# Patient Record
Sex: Female | Born: 1960 | Race: White | Hispanic: No | Marital: Married | Smoking: Never smoker
Health system: Southern US, Community
[De-identification: ages and names within clinical notes are randomized; demographics above are authoritative.]

## PROBLEM LIST (undated history)

## (undated) DIAGNOSIS — E119 Type 2 diabetes mellitus without complications: Secondary | ICD-10-CM

## (undated) DIAGNOSIS — M8580 Other specified disorders of bone density and structure, unspecified site: Secondary | ICD-10-CM

## (undated) DIAGNOSIS — E785 Hyperlipidemia, unspecified: Secondary | ICD-10-CM

## (undated) DIAGNOSIS — I10 Essential (primary) hypertension: Secondary | ICD-10-CM

## (undated) HISTORY — PX: BREAST CYST ASPIRATION: SHX578

## (undated) HISTORY — DX: Hyperlipidemia, unspecified: E78.5

## (undated) HISTORY — DX: Type 2 diabetes mellitus without complications: E11.9

## (undated) HISTORY — DX: Other specified disorders of bone density and structure, unspecified site: M85.80

## (undated) HISTORY — DX: Essential (primary) hypertension: I10

---

## 1998-02-13 ENCOUNTER — Encounter: Admission: RE | Admit: 1998-02-13 | Discharge: 1998-02-13 | Payer: Self-pay | Admitting: *Deleted

## 1998-10-05 ENCOUNTER — Other Ambulatory Visit: Admission: RE | Admit: 1998-10-05 | Discharge: 1998-10-05 | Payer: Self-pay | Admitting: Obstetrics and Gynecology

## 1999-11-05 ENCOUNTER — Other Ambulatory Visit: Admission: RE | Admit: 1999-11-05 | Discharge: 1999-11-05 | Payer: Self-pay | Admitting: Obstetrics and Gynecology

## 2000-10-27 ENCOUNTER — Encounter: Payer: Self-pay | Admitting: Obstetrics and Gynecology

## 2000-10-27 ENCOUNTER — Ambulatory Visit (HOSPITAL_COMMUNITY): Admission: RE | Admit: 2000-10-27 | Discharge: 2000-10-27 | Payer: Self-pay | Admitting: Obstetrics and Gynecology

## 2001-02-05 ENCOUNTER — Other Ambulatory Visit: Admission: RE | Admit: 2001-02-05 | Discharge: 2001-02-05 | Payer: Self-pay | Admitting: Obstetrics and Gynecology

## 2002-02-01 ENCOUNTER — Ambulatory Visit (HOSPITAL_COMMUNITY): Admission: RE | Admit: 2002-02-01 | Discharge: 2002-02-01 | Payer: Self-pay | Admitting: Obstetrics and Gynecology

## 2002-02-01 ENCOUNTER — Encounter: Payer: Self-pay | Admitting: Obstetrics and Gynecology

## 2002-04-20 ENCOUNTER — Other Ambulatory Visit: Admission: RE | Admit: 2002-04-20 | Discharge: 2002-04-20 | Payer: Self-pay | Admitting: Obstetrics and Gynecology

## 2003-02-15 ENCOUNTER — Encounter: Payer: Self-pay | Admitting: Obstetrics and Gynecology

## 2003-02-15 ENCOUNTER — Encounter: Admission: RE | Admit: 2003-02-15 | Discharge: 2003-02-15 | Payer: Self-pay | Admitting: Obstetrics and Gynecology

## 2003-04-25 ENCOUNTER — Other Ambulatory Visit: Admission: RE | Admit: 2003-04-25 | Discharge: 2003-04-25 | Payer: Self-pay | Admitting: Obstetrics and Gynecology

## 2004-03-09 ENCOUNTER — Encounter: Admission: RE | Admit: 2004-03-09 | Discharge: 2004-03-09 | Payer: Self-pay | Admitting: Internal Medicine

## 2005-05-07 ENCOUNTER — Encounter: Admission: RE | Admit: 2005-05-07 | Discharge: 2005-05-07 | Payer: Self-pay | Admitting: Obstetrics and Gynecology

## 2006-06-06 ENCOUNTER — Ambulatory Visit (HOSPITAL_COMMUNITY): Admission: RE | Admit: 2006-06-06 | Discharge: 2006-06-06 | Payer: Self-pay | Admitting: Obstetrics and Gynecology

## 2007-06-24 ENCOUNTER — Ambulatory Visit (HOSPITAL_COMMUNITY): Admission: RE | Admit: 2007-06-24 | Discharge: 2007-06-24 | Payer: Self-pay | Admitting: Obstetrics and Gynecology

## 2008-06-27 ENCOUNTER — Ambulatory Visit (HOSPITAL_COMMUNITY): Admission: RE | Admit: 2008-06-27 | Discharge: 2008-06-27 | Payer: Self-pay | Admitting: Obstetrics and Gynecology

## 2009-06-29 ENCOUNTER — Ambulatory Visit (HOSPITAL_COMMUNITY): Admission: RE | Admit: 2009-06-29 | Discharge: 2009-06-29 | Payer: Self-pay | Admitting: Family Medicine

## 2010-07-04 ENCOUNTER — Ambulatory Visit (HOSPITAL_COMMUNITY)
Admission: RE | Admit: 2010-07-04 | Discharge: 2010-07-04 | Payer: Self-pay | Source: Home / Self Care | Attending: Obstetrics and Gynecology | Admitting: Obstetrics and Gynecology

## 2010-08-19 ENCOUNTER — Encounter: Payer: Self-pay | Admitting: Obstetrics and Gynecology

## 2011-06-10 ENCOUNTER — Other Ambulatory Visit (HOSPITAL_COMMUNITY): Payer: Self-pay | Admitting: Nurse Practitioner

## 2011-06-10 DIAGNOSIS — Z1231 Encounter for screening mammogram for malignant neoplasm of breast: Secondary | ICD-10-CM

## 2011-07-10 ENCOUNTER — Ambulatory Visit (HOSPITAL_COMMUNITY)
Admission: RE | Admit: 2011-07-10 | Discharge: 2011-07-10 | Disposition: A | Discharge status: Home / Self Care | Payer: BC Managed Care – PPO | Source: Ambulatory Visit | Attending: Nurse Practitioner | Admitting: Nurse Practitioner

## 2011-07-10 DIAGNOSIS — Z1231 Encounter for screening mammogram for malignant neoplasm of breast: Secondary | ICD-10-CM

## 2012-10-29 ENCOUNTER — Other Ambulatory Visit (HOSPITAL_COMMUNITY): Payer: Self-pay | Admitting: Nurse Practitioner

## 2012-10-29 DIAGNOSIS — Z1231 Encounter for screening mammogram for malignant neoplasm of breast: Secondary | ICD-10-CM

## 2012-11-05 ENCOUNTER — Ambulatory Visit (HOSPITAL_COMMUNITY): Payer: BC Managed Care – PPO

## 2012-11-06 ENCOUNTER — Ambulatory Visit (HOSPITAL_COMMUNITY)
Admission: RE | Admit: 2012-11-06 | Discharge: 2012-11-06 | Disposition: A | Discharge status: Home / Self Care | Payer: BC Managed Care – PPO | Source: Ambulatory Visit | Attending: Nurse Practitioner | Admitting: Nurse Practitioner

## 2012-11-06 DIAGNOSIS — Z1231 Encounter for screening mammogram for malignant neoplasm of breast: Secondary | ICD-10-CM | POA: Insufficient documentation

## 2013-10-20 ENCOUNTER — Other Ambulatory Visit (HOSPITAL_COMMUNITY): Payer: Self-pay | Admitting: Nurse Practitioner

## 2013-10-20 DIAGNOSIS — Z1231 Encounter for screening mammogram for malignant neoplasm of breast: Secondary | ICD-10-CM

## 2013-11-08 ENCOUNTER — Ambulatory Visit (HOSPITAL_COMMUNITY)
Admission: RE | Admit: 2013-11-08 | Discharge: 2013-11-08 | Disposition: A | Discharge status: Home / Self Care | Payer: BC Managed Care – PPO | Source: Ambulatory Visit | Attending: Nurse Practitioner | Admitting: Nurse Practitioner

## 2013-11-08 DIAGNOSIS — Z1231 Encounter for screening mammogram for malignant neoplasm of breast: Secondary | ICD-10-CM | POA: Insufficient documentation

## 2014-03-14 ENCOUNTER — Ambulatory Visit (INDEPENDENT_AMBULATORY_CARE_PROVIDER_SITE_OTHER): Payer: BC Managed Care – PPO | Admitting: Podiatry

## 2014-03-14 ENCOUNTER — Encounter: Payer: Self-pay | Admitting: Podiatry

## 2014-03-14 ENCOUNTER — Ambulatory Visit (INDEPENDENT_AMBULATORY_CARE_PROVIDER_SITE_OTHER): Payer: BC Managed Care – PPO

## 2014-03-14 VITALS — BP 108/55 | HR 98 | Resp 16 | Ht 63.0 in | Wt 160.0 lb

## 2014-03-14 DIAGNOSIS — M722 Plantar fascial fibromatosis: Secondary | ICD-10-CM

## 2014-03-14 MED ORDER — TRIAMCINOLONE ACETONIDE 10 MG/ML IJ SUSP
10.0000 mg | Freq: Once | INTRAMUSCULAR | Status: AC
  Administered 2014-03-14: 10 mg

## 2014-03-14 NOTE — Progress Notes (Signed)
   Subjective:    Patient ID: Hannah Cantrell, female    DOB: October 14, 1960, 53 y.o.   MRN: [id number]  HPI Comments: "I have pain in the right one"  Patient c/o aching plantar heel and some posterior heel right foot for a few months. She does have AM pain but usually gets worse by end of day. Saw PCP and was Rx 'd meloxicam,. Using water bottle massage and new shoes at home.   Foot Pain Associated symptoms include a rash.      Review of Systems  Skin: Positive for rash.       Change in nails  All other systems reviewed and are negative.      Objective:   Physical Exam        Assessment & Plan:

## 2014-03-14 NOTE — Patient Instructions (Signed)

## 2014-03-16 NOTE — Progress Notes (Signed)
Subjective:     Patient ID: Hannah Cantrell, female   DOB: [birth year], 53 y.o.   MRN: [id number]  Foot Pain   patient is found to have painful plantar heel right of several months duration with pain upon palpation and worse pain in the morning and now pretty much all times during the day   Review of Systems  All other systems reviewed and are negative.      Objective:   Physical Exam  Nursing note and vitals reviewed. Constitutional: She is oriented to person, place, and time.  Cardiovascular: Intact distal pulses.   Musculoskeletal: Normal range of motion.  Neurological: She is oriented to person, place, and time.  Skin: Skin is warm.   neurovascular status found to be intact with muscle strength adequate and range of motion subtalar and midtarsal joint within normal limits. Patient is noted to have significant discomfort plantar heel at the insertional point of the tendon into the calcaneus with inflammation slightly distal also noted and no indications of forefoot pathology. Digits are well-perfused and arch height is mildly diminished upon weightbearing     Assessment:     Acute plan her fasciitis right that has failed to respond to conservative care up to this point    Plan:     H&P and x-ray reviewed. Injected the plantar fascia 3 mg Kenalog 5 mg like Marcaine mixture and dispensed a fascially brace for daily usage. Do to intense morning pain I did dispensed night splint to sleeping and instructed on ice therapy and placed on diclofenac 75 mg twice a day and reappoint again in 1 week for consideration of orthotics if we can convert this to a chronic problem

## 2014-03-17 ENCOUNTER — Ambulatory Visit: Payer: Self-pay | Admitting: Podiatry

## 2014-04-05 ENCOUNTER — Encounter: Payer: Self-pay | Admitting: Podiatry

## 2014-04-05 ENCOUNTER — Ambulatory Visit (INDEPENDENT_AMBULATORY_CARE_PROVIDER_SITE_OTHER): Payer: BC Managed Care – PPO | Admitting: Podiatry

## 2014-04-05 VITALS — BP 93/75 | HR 111 | Resp 16

## 2014-04-05 DIAGNOSIS — M722 Plantar fascial fibromatosis: Secondary | ICD-10-CM

## 2014-04-05 NOTE — Progress Notes (Signed)
She presents today for followup of plantar fasciitis. She was doing very well after having seen Dr. Paulla Dolly in the subsequent injection. However over the weekend she was chasing a dog and felt a pull in her heel.  Objective: Vital signs are stable she is alert and oriented x3. Pulses are palpable. She has pain on palpation medial continued tubercle of her right heel.  Assessment: Plantar fasciitis right.  Plan: Injected the right heel today with Kenalog and local anesthetic. She will followup with Dr. Paulla Dolly in one month.

## 2014-04-05 NOTE — Patient Instructions (Signed)

## 2014-04-08 ENCOUNTER — Other Ambulatory Visit: Payer: BC Managed Care – PPO

## 2014-05-02 ENCOUNTER — Ambulatory Visit (INDEPENDENT_AMBULATORY_CARE_PROVIDER_SITE_OTHER): Payer: BC Managed Care – PPO | Admitting: Podiatry

## 2014-05-02 ENCOUNTER — Encounter: Payer: Self-pay | Admitting: Podiatry

## 2014-05-02 VITALS — BP 110/65 | HR 66 | Resp 17

## 2014-05-02 DIAGNOSIS — M722 Plantar fascial fibromatosis: Secondary | ICD-10-CM

## 2014-05-03 NOTE — Progress Notes (Signed)
Subjective:     Patient ID: Hannah Cantrell, female   DOB: Oct 23, 1960, 53 y.o.   MRN: [id number]  HPI patient states I'm still getting pain in my right heel when I walk and it seems to be worse when I get up in the morning   Review of Systems     Objective:   Physical Exam Neurovascular status intact with pain to palpation medial calcaneal tubercle right at the insertion of the plantar fashion    Assessment:     Plantar fasciitis right with inflammation and fluid buildup still noted especially after rest    Plan:     Reviewed condition and exercises physical therapy. At this time I went ahead and I dispensed a night splint with instructions on usage and reappoint her recheck

## 2014-05-30 ENCOUNTER — Ambulatory Visit: Payer: BC Managed Care – PPO | Admitting: Podiatry

## 2014-12-09 ENCOUNTER — Other Ambulatory Visit (HOSPITAL_COMMUNITY): Payer: Self-pay | Admitting: Nurse Practitioner

## 2014-12-09 DIAGNOSIS — Z1231 Encounter for screening mammogram for malignant neoplasm of breast: Secondary | ICD-10-CM

## 2014-12-28 ENCOUNTER — Ambulatory Visit (HOSPITAL_COMMUNITY)
Admission: RE | Admit: 2014-12-28 | Discharge: 2014-12-28 | Disposition: A | Discharge status: Home / Self Care | Payer: BC Managed Care – PPO | Source: Ambulatory Visit | Attending: Nurse Practitioner | Admitting: Nurse Practitioner

## 2014-12-28 DIAGNOSIS — Z1231 Encounter for screening mammogram for malignant neoplasm of breast: Secondary | ICD-10-CM | POA: Diagnosis not present

## 2015-12-01 ENCOUNTER — Other Ambulatory Visit: Payer: Self-pay

## 2015-12-01 DIAGNOSIS — Z1231 Encounter for screening mammogram for malignant neoplasm of breast: Secondary | ICD-10-CM

## 2016-01-01 ENCOUNTER — Ambulatory Visit: Payer: BC Managed Care – PPO

## 2016-01-15 ENCOUNTER — Other Ambulatory Visit: Payer: Self-pay | Admitting: Nurse Practitioner

## 2016-01-15 ENCOUNTER — Ambulatory Visit
Admission: RE | Admit: 2016-01-15 | Discharge: 2016-01-15 | Disposition: A | Discharge status: Home / Self Care | Payer: BC Managed Care – PPO | Source: Ambulatory Visit

## 2016-01-15 DIAGNOSIS — Z1231 Encounter for screening mammogram for malignant neoplasm of breast: Secondary | ICD-10-CM

## 2016-07-02 ENCOUNTER — Telehealth (INDEPENDENT_AMBULATORY_CARE_PROVIDER_SITE_OTHER): Payer: Self-pay | Admitting: Orthopaedic Surgery

## 2016-07-02 NOTE — Telephone Encounter (Signed)
She hasn't had one since May, she can have one whenever she wants

## 2016-07-10 ENCOUNTER — Ambulatory Visit (INDEPENDENT_AMBULATORY_CARE_PROVIDER_SITE_OTHER): Payer: BC Managed Care – PPO | Admitting: Orthopaedic Surgery

## 2016-08-05 ENCOUNTER — Ambulatory Visit (INDEPENDENT_AMBULATORY_CARE_PROVIDER_SITE_OTHER): Payer: BC Managed Care – PPO | Admitting: Orthopaedic Surgery

## 2016-08-05 DIAGNOSIS — M7062 Trochanteric bursitis, left hip: Secondary | ICD-10-CM

## 2016-08-05 MED ORDER — LIDOCAINE HCL 1 % IJ SOLN
3.0000 mL | INTRAMUSCULAR | Status: AC | PRN
  Administered 2016-08-05: 3 mL

## 2016-08-05 NOTE — Progress Notes (Signed)
   Office Visit Note   Patient: Hannah Cantrell           Date of Birth: [birth year]           MRN: [id number] Visit Date: 08/05/2016              Requested by: Chesley Noon, MD [place], [place] [id number] PCP: Chesley Noon, MD   Assessment & Plan: Visit Diagnoses: No diagnosis found.  Plan: She tolerated the left hip trochanteric injection well. This point she'll follow-up as needed. I showed her stretching exercises to try and she'll take naproxen as well. If she has any issues she'll let us know.  Follow-Up Instructions: Return if symptoms worsen or fail to improve.   Orders:  No orders of the defined types were placed in this encounter.  No orders of the defined types were placed in this encounter.     Procedures: Large Joint Inj Date/Time: 08/05/2016 5:08 PM Performed by: Mcarthur Rossetti Authorized by: Mcarthur Rossetti   Location:  Hip Site:  L greater trochanter Ultrasound Guidance: No   Fluoroscopic Guidance: No   Arthrogram: No   Medications:  3 mL lidocaine 1 %     Clinical Data: No additional findings.   Subjective: No chief complaint on file. The patient has a history of left hip trochanteric bursitis. We last provided injection around the trochanteric area in May 2017. She has done well until recently and developed pain again over the trochanteric area. She's been to physical therapy before without prior to injection. She is not tried and stretching exercises recently. She's taken some naproxen as needed. She still denies any groin pain.  HPI  Review of Systems   Objective: Vital Signs: There were no vitals taken for this visit.  Physical Exam He is alert and oriented 3 in no acute distress Ortho Exam Examination of her left hip shows only pain over the trochanteric area and the rest her hip exam is normal Specialty Comments:  No specialty comments available.  Imaging: No results found.   PMFS  History: There are no active problems to display for this patient.  Past Medical History:  Diagnosis Date  . Diabetes mellitus without complication   . Hyperlipidemia   . Hypertension     No family history on file.  No past surgical history on file. Social History   Occupational History  . Not on file.   Social History Main Topics  . Smoking status: Never Smoker  . Smokeless tobacco: Not on file  . Alcohol use Not on file  . Drug use: Unknown  . Sexual activity: Not on file

## 2016-12-10 ENCOUNTER — Other Ambulatory Visit: Payer: Self-pay | Admitting: Nurse Practitioner

## 2016-12-10 DIAGNOSIS — Z1231 Encounter for screening mammogram for malignant neoplasm of breast: Secondary | ICD-10-CM

## 2017-01-15 ENCOUNTER — Ambulatory Visit
Admission: RE | Admit: 2017-01-15 | Discharge: 2017-01-15 | Disposition: A | Discharge status: Home / Self Care | Payer: BC Managed Care – PPO | Source: Ambulatory Visit | Attending: Nurse Practitioner | Admitting: Nurse Practitioner

## 2017-01-15 DIAGNOSIS — Z1231 Encounter for screening mammogram for malignant neoplasm of breast: Secondary | ICD-10-CM

## 2017-11-03 ENCOUNTER — Ambulatory Visit: Payer: BC Managed Care – PPO | Admitting: Obstetrics & Gynecology

## 2017-11-03 ENCOUNTER — Encounter: Payer: Self-pay | Admitting: Obstetrics & Gynecology

## 2017-11-03 VITALS — BP 118/70 | Ht 63.0 in | Wt 150.0 lb

## 2017-11-03 DIAGNOSIS — Z780 Asymptomatic menopausal state: Secondary | ICD-10-CM

## 2017-11-03 DIAGNOSIS — Z01419 Encounter for gynecological examination (general) (routine) without abnormal findings: Secondary | ICD-10-CM | POA: Diagnosis not present

## 2017-11-03 DIAGNOSIS — Z13820 Encounter for screening for osteoporosis: Secondary | ICD-10-CM | POA: Diagnosis not present

## 2017-11-03 NOTE — Progress Notes (Signed)
Hannah Cantrell 1961/03/24 [id number]   History:    57 y.o. G2P2L2 Married  RP:  Established patient presenting for annual gyn exam   HPI: Menopause, well on no HRT.  Had mild postmenopausal bleeding in March 2018 with an endometrial biopsy showing an atrophic benign endometrium and a thin endometrial line on pelvic ultrasound at 1.6 mm.  No postmenopausal bleeding since then.  No pelvic pain.  Normal vaginal secretions.  No pain with intercourse.  Urine and bowel movements normal.  Breasts normal.  Body mass index 26.57.  Health labs with family physician.  Past medical history,surgical history, family history and social history were all reviewed and documented in the EPIC chart.  Gynecologic History No LMP recorded. Patient is postmenopausal. Contraception: post menopausal status Last Pap: 09/2016. Results were: Negative/HPV HR neg Last mammogram: 12/2016. Results were: Negative Bone Density: Never Colonoscopy: 2018  Obstetric History OB History  Gravida Para Term Preterm AB Living  2 2       2   SAB TAB Ectopic Multiple Live Births               # Outcome Date GA Lbr Len/2nd Weight Sex Delivery Anes PTL Lv  2 Para           1 Para              ROS: A ROS was performed and pertinent positives and negatives are included in the history.  GENERAL: No fevers or chills. HEENT: No change in vision, no earache, sore throat or sinus congestion. NECK: No pain or stiffness. CARDIOVASCULAR: No chest pain or pressure. No palpitations. PULMONARY: No shortness of breath, cough or wheeze. GASTROINTESTINAL: No abdominal pain, nausea, vomiting or diarrhea, melena or bright red blood per rectum. GENITOURINARY: No urinary frequency, urgency, hesitancy or dysuria. MUSCULOSKELETAL: No joint or muscle pain, no back pain, no recent trauma. DERMATOLOGIC: No rash, no itching, no lesions. ENDOCRINE: No polyuria, polydipsia, no heat or cold intolerance. No recent change in weight. HEMATOLOGICAL: No  anemia or easy bruising or bleeding. NEUROLOGIC: No headache, seizures, numbness, tingling or weakness. PSYCHIATRIC: No depression, no loss of interest in normal activity or change in sleep pattern.     Exam:   BP 118/70   Ht 5\' 3"  (1.6 m)   Wt 150 lb (68 kg)   BMI 26.57 kg/m   Body mass index is 26.57 kg/m.  General appearance : Well developed well nourished female. No acute distress HEENT: Eyes: no retinal hemorrhage or exudates,  Neck supple, trachea midline, no carotid bruits, no thyroidmegaly Lungs: Clear to auscultation, no rhonchi or wheezes, or rib retractions  Heart: Regular rate and rhythm, no murmurs or gallops Breast:Examined in sitting and supine position were symmetrical in appearance, no palpable masses or tenderness,  no skin retraction, no nipple inversion, no nipple discharge, no skin discoloration, no axillary or supraclavicular lymphadenopathy Abdomen: no palpable masses or tenderness, no rebound or guarding Extremities: no edema or skin discoloration or tenderness  Pelvic: Vulva: Normal             Vagina: No gross lesions or discharge  Cervix: No gross lesions or discharge.  Pap reflex done  Uterus  AV, normal size, shape and consistency, non-tender and mobile  Adnexa  Without masses or tenderness  Anus: Normal   Assessment/Plan:  57 y.o. female for annual exam   1. Encounter for routine gynecological examination with Papanicolaou smear of cervix  Normal gynecologic exam.  Pap reflex  done.  Breast exam normal.  Will repeat screening mammogram in June 2019.  Health labs with family physician.  Colonoscopy in 2018.  2. Menopause present Well on no hormone replacement therapy.  No postmenopausal bleeding.  3. Screening for osteoporosis Vitamin D supplements recommended, calcium rich nutrition and regular weightbearing physical activity.  Will follow up here for bone density. - DG Bone Density; Future  Princess Bruins MD, 3:19 PM 11/03/2017

## 2017-11-03 NOTE — Patient Instructions (Addendum)
1. Encounter for routine gynecological examination with Papanicolaou smear of cervix  Normal gynecologic exam.  Pap reflex done.  Breast exam normal.  Will repeat screening mammogram in June 2019.  Health labs with family physician.  Colonoscopy in 2018.  2. Menopause present Well on no hormone replacement therapy.  No postmenopausal bleeding.  3. Screening for osteoporosis Vitamin D supplements recommended, calcium rich nutrition and regular weightbearing physical activity.  Will follow up here for bone density. - DG Bone Density; Future  Hannah Cantrell, it was a pleasure seeing you today!  I will inform you of your results as soon as they are available.   Health Maintenance for Postmenopausal Women Menopause is a normal process in which your reproductive ability comes to an end. This process happens gradually over a span of months to years, usually between the ages of 19 and 62. Menopause is complete when you have missed 12 consecutive menstrual periods. It is important to talk with your health care provider about some of the most common conditions that affect postmenopausal women, such as heart disease, cancer, and bone loss (osteoporosis). Adopting a healthy lifestyle and getting preventive care can help to promote your health and wellness. Those actions can also lower your chances of developing some of these common conditions. What should I know about menopause? During menopause, you may experience a number of symptoms, such as:  Moderate-to-severe hot flashes.  Night sweats.  Decrease in sex drive.  Mood swings.  Headaches.  Tiredness.  Irritability.  Memory problems.  Insomnia.  Choosing to treat or not to treat menopausal changes is an individual decision that you make with your health care provider. What should I know about hormone replacement therapy and supplements? Hormone therapy products are effective for treating symptoms that are associated with menopause, such as hot  flashes and night sweats. Hormone replacement carries certain risks, especially as you become older. If you are thinking about using estrogen or estrogen with progestin treatments, discuss the benefits and risks with your health care provider. What should I know about heart disease and stroke? Heart disease, heart attack, and stroke become more likely as you age. This may be due, in part, to the hormonal changes that your body experiences during menopause. These can affect how your body processes dietary fats, triglycerides, and cholesterol. Heart attack and stroke are both medical emergencies. There are many things that you can do to help prevent heart disease and stroke:  Have your blood pressure checked at least every 1-2 years. High blood pressure causes heart disease and increases the risk of stroke.  If you are 33-80 years old, ask your health care provider if you should take aspirin to prevent a heart attack or a stroke.  Do not use any tobacco products, including cigarettes, chewing tobacco, or electronic cigarettes. If you need help quitting, ask your health care provider.  It is important to eat a healthy diet and maintain a healthy weight. ? Be sure to include plenty of vegetables, fruits, low-fat dairy products, and lean protein. ? Avoid eating foods that are high in solid fats, added sugars, or salt (sodium).  Get regular exercise. This is one of the most important things that you can do for your health. ? Try to exercise for at least 150 minutes each week. The type of exercise that you do should increase your heart rate and make you sweat. This is known as moderate-intensity exercise. ? Try to do strengthening exercises at least twice each week. Do these  in addition to the moderate-intensity exercise.  Know your numbers.Ask your health care provider to check your cholesterol and your blood glucose. Continue to have your blood tested as directed by your health care provider.  What  should I know about cancer screening? There are several types of cancer. Take the following steps to reduce your risk and to catch any cancer development as early as possible. Breast Cancer  Practice breast self-awareness. ? This means understanding how your breasts normally appear and feel. ? It also means doing regular breast self-exams. Let your health care provider know about any changes, no matter how small.  If you are 101 or older, have a clinician do a breast exam (clinical breast exam or CBE) every year. Depending on your age, family history, and medical history, it may be recommended that you also have a yearly breast X-ray (mammogram).  If you have a family history of breast cancer, talk with your health care provider about genetic screening.  If you are at high risk for breast cancer, talk with your health care provider about having an MRI and a mammogram every year.  Breast cancer (BRCA) gene test is recommended for women who have family members with BRCA-related cancers. Results of the assessment will determine the need for genetic counseling and BRCA1 and for BRCA2 testing. BRCA-related cancers include these types: ? Breast. This occurs in males or females. ? Ovarian. ? Tubal. This may also be called fallopian tube cancer. ? Cancer of the abdominal or pelvic lining (peritoneal cancer). ? Prostate. ? Pancreatic.  Cervical, Uterine, and Ovarian Cancer Your health care provider may recommend that you be screened regularly for cancer of the pelvic organs. These include your ovaries, uterus, and vagina. This screening involves a pelvic exam, which includes checking for microscopic changes to the surface of your cervix (Pap test).  For women ages 21-65, health care providers may recommend a pelvic exam and a Pap test every three years. For women ages 12-65, they may recommend the Pap test and pelvic exam, combined with testing for human papilloma virus (HPV), every five years. Some  types of HPV increase your risk of cervical cancer. Testing for HPV may also be done on women of any age who have unclear Pap test results.  Other health care providers may not recommend any screening for nonpregnant women who are considered low risk for pelvic cancer and have no symptoms. Ask your health care provider if a screening pelvic exam is right for you.  If you have had past treatment for cervical cancer or a condition that could lead to cancer, you need Pap tests and screening for cancer for at least 20 years after your treatment. If Pap tests have been discontinued for you, your risk factors (such as having a new sexual partner) need to be reassessed to determine if you should start having screenings again. Some women have medical problems that increase the chance of getting cervical cancer. In these cases, your health care provider may recommend that you have screening and Pap tests more often.  If you have a family history of uterine cancer or ovarian cancer, talk with your health care provider about genetic screening.  If you have vaginal bleeding after reaching menopause, tell your health care provider.  There are currently no reliable tests available to screen for ovarian cancer.  Lung Cancer Lung cancer screening is recommended for adults 59-62 years old who are at high risk for lung cancer because of a history of smoking.  A yearly low-dose CT scan of the lungs is recommended if you:  Currently smoke.  Have a history of at least 30 pack-years of smoking and you currently smoke or have quit within the past 15 years. A pack-year is smoking an average of one pack of cigarettes per day for one year.  Yearly screening should:  Continue until it has been 15 years since you quit.  Stop if you develop a health problem that would prevent you from having lung cancer treatment.  Colorectal Cancer  This type of cancer can be detected and can often be prevented.  Routine colorectal  cancer screening usually begins at age 10 and continues through age 15.  If you have risk factors for colon cancer, your health care provider may recommend that you be screened at an earlier age.  If you have a family history of colorectal cancer, talk with your health care provider about genetic screening.  Your health care provider may also recommend using home test kits to check for hidden blood in your stool.  A small camera at the end of a tube can be used to examine your colon directly (sigmoidoscopy or colonoscopy). This is done to check for the earliest forms of colorectal cancer.  Direct examination of the colon should be repeated every 5-10 years until age 57. However, if early forms of precancerous polyps or small growths are found or if you have a family history or genetic risk for colorectal cancer, you may need to be screened more often.  Skin Cancer  Check your skin from head to toe regularly.  Monitor any moles. Be sure to tell your health care provider: ? About any new moles or changes in moles, especially if there is a change in a mole's shape or color. ? If you have a mole that is larger than the size of a pencil eraser.  If any of your family members has a history of skin cancer, especially at a young age, talk with your health care provider about genetic screening.  Always use sunscreen. Apply sunscreen liberally and repeatedly throughout the day.  Whenever you are outside, protect yourself by wearing long sleeves, pants, a wide-brimmed hat, and sunglasses.  What should I know about osteoporosis? Osteoporosis is a condition in which bone destruction happens more quickly than new bone creation. After menopause, you may be at an increased risk for osteoporosis. To help prevent osteoporosis or the bone fractures that can happen because of osteoporosis, the following is recommended:  If you are 45-70 years old, get at least 1,000 mg of calcium and at least 600 mg of  vitamin D per day.  If you are older than age 35 but younger than age 43, get at least 1,200 mg of calcium and at least 600 mg of vitamin D per day.  If you are older than age 46, get at least 1,200 mg of calcium and at least 800 mg of vitamin D per day.  Smoking and excessive alcohol intake increase the risk of osteoporosis. Eat foods that are rich in calcium and vitamin D, and do weight-bearing exercises several times each week as directed by your health care provider. What should I know about how menopause affects my mental health? Depression may occur at any age, but it is more common as you become older. Common symptoms of depression include:  Low or sad mood.  Changes in sleep patterns.  Changes in appetite or eating patterns.  Feeling an overall lack of motivation  or enjoyment of activities that you previously enjoyed.  Frequent crying spells.  Talk with your health care provider if you think that you are experiencing depression. What should I know about immunizations? It is important that you get and maintain your immunizations. These include:  Tetanus, diphtheria, and pertussis (Tdap) booster vaccine.  Influenza every year before the flu season begins.  Pneumonia vaccine.  Shingles vaccine.  Your health care provider may also recommend other immunizations. This information is not intended to replace advice given to you by your health care provider. Make sure you discuss any questions you have with your health care provider. Document Released: 09/06/2005 Document Revised: 02/02/2016 Document Reviewed: 04/18/2015 Elsevier Interactive Patient Education  2018 [employer].

## 2017-11-05 LAB — PAP IG W/ RFLX HPV ASCU

## 2017-11-25 ENCOUNTER — Other Ambulatory Visit: Payer: Self-pay | Admitting: Gynecology

## 2017-11-25 DIAGNOSIS — Z13820 Encounter for screening for osteoporosis: Secondary | ICD-10-CM

## 2017-11-26 DIAGNOSIS — M8580 Other specified disorders of bone density and structure, unspecified site: Secondary | ICD-10-CM

## 2017-11-26 HISTORY — DX: Other specified disorders of bone density and structure, unspecified site: M85.80

## 2017-12-02 ENCOUNTER — Other Ambulatory Visit: Payer: Self-pay | Admitting: Obstetrics & Gynecology

## 2017-12-02 DIAGNOSIS — Z1231 Encounter for screening mammogram for malignant neoplasm of breast: Secondary | ICD-10-CM

## 2017-12-08 ENCOUNTER — Encounter: Payer: Self-pay | Admitting: Gynecology

## 2017-12-08 ENCOUNTER — Ambulatory Visit (INDEPENDENT_AMBULATORY_CARE_PROVIDER_SITE_OTHER): Payer: BC Managed Care – PPO

## 2017-12-08 ENCOUNTER — Other Ambulatory Visit: Payer: Self-pay | Admitting: Gynecology

## 2017-12-08 DIAGNOSIS — Z13820 Encounter for screening for osteoporosis: Secondary | ICD-10-CM | POA: Diagnosis not present

## 2017-12-08 DIAGNOSIS — M8589 Other specified disorders of bone density and structure, multiple sites: Secondary | ICD-10-CM

## 2018-01-16 ENCOUNTER — Ambulatory Visit: Payer: BC Managed Care – PPO

## 2018-02-02 ENCOUNTER — Encounter: Payer: Self-pay | Admitting: Obstetrics & Gynecology

## 2018-02-03 ENCOUNTER — Ambulatory Visit
Admission: RE | Admit: 2018-02-03 | Discharge: 2018-02-03 | Disposition: A | Discharge status: Home / Self Care | Payer: BC Managed Care – PPO | Source: Ambulatory Visit | Attending: Obstetrics & Gynecology | Admitting: Obstetrics & Gynecology

## 2018-02-03 DIAGNOSIS — Z1231 Encounter for screening mammogram for malignant neoplasm of breast: Secondary | ICD-10-CM

## 2018-11-05 ENCOUNTER — Encounter: Payer: BC Managed Care – PPO | Admitting: Obstetrics & Gynecology

## 2018-12-24 ENCOUNTER — Other Ambulatory Visit: Payer: Self-pay | Admitting: Obstetrics & Gynecology

## 2018-12-24 DIAGNOSIS — Z1231 Encounter for screening mammogram for malignant neoplasm of breast: Secondary | ICD-10-CM

## 2019-01-05 ENCOUNTER — Other Ambulatory Visit: Payer: Self-pay

## 2019-01-06 ENCOUNTER — Ambulatory Visit: Payer: BC Managed Care – PPO | Admitting: Obstetrics & Gynecology

## 2019-01-06 ENCOUNTER — Encounter: Payer: Self-pay | Admitting: Obstetrics & Gynecology

## 2019-01-06 VITALS — BP 124/76 | Ht 63.0 in | Wt 160.0 lb

## 2019-01-06 DIAGNOSIS — Z8742 Personal history of other diseases of the female genital tract: Secondary | ICD-10-CM | POA: Diagnosis not present

## 2019-01-06 DIAGNOSIS — Z01419 Encounter for gynecological examination (general) (routine) without abnormal findings: Secondary | ICD-10-CM | POA: Diagnosis not present

## 2019-01-06 DIAGNOSIS — Z780 Asymptomatic menopausal state: Secondary | ICD-10-CM

## 2019-01-06 DIAGNOSIS — M8589 Other specified disorders of bone density and structure, multiple sites: Secondary | ICD-10-CM

## 2019-01-06 DIAGNOSIS — E663 Overweight: Secondary | ICD-10-CM

## 2019-01-06 NOTE — Patient Instructions (Signed)
1. Encounter for routine gynecological examination with Papanicolaou smear of cervix Normal gynecologic exam in menopause.  Pap reflex done, Pap done this year because of history of abnormal Pap test a few years ago.  Breast exam normal.  Last screening mammogram July 2019 was negative.  Colonoscopy in 2018.  Health labs at family [facility] office.  2. H/O abnormal cervical Papanicolaou smear  3. Postmenopause Well on no hormone replacement therapy.  No postmenopausal bleeding.  4. Osteopenia of multiple sites Osteopenia on bone density May 2019.  Recommend vitamin D supplements, calcium intake of 1200 mg daily and regular weightbearing physical activities.  5. Overweight (BMI 25.0-29.9) Recommend a slightly lower calorie/carb diet such as [employer].  Aerobic physical activities 5 times a week and weightlifting every 2 days.  Other orders - PRESCRIPTION MEDICATION; Insulin injection- name?  Hannah Cantrell, it was a pleasure seeing you today!  I will inform you of your results as soon as they are available.

## 2019-01-06 NOTE — Progress Notes (Signed)
Hannah Cantrell Dec 14, 1960 [id number]   History:    58 y.o. G2P2L2 Married.  Daughter 56 yo, son 76 yo.  RP:  Established patient presenting for annual gyn exam   HPI: Postmenopause, well on no HRT.  No PMB.  No pelvic pain.  No pain with IC.  Urine/BMs normal.  Breasts normal.  BMI 28.34.  Not exercising regularly.  DM treated with Insulin since last year.  Health labs with Fam MD.  Past medical history,surgical history, family history and social history were all reviewed and documented in the EPIC chart.  Gynecologic History No LMP recorded. Patient is postmenopausal. Contraception: post menopausal status Last Pap: 10/2017. Results were: Negative Last mammogram: 01/2018. Results were: Negative Bone Density: 11/2017 Osteopenia Colonoscopy: 2018  Obstetric History OB History  Gravida Para Term Preterm AB Living  2 2       2   SAB TAB Ectopic Multiple Live Births               # Outcome Date GA Lbr Len/2nd Weight Sex Delivery Anes PTL Lv  2 Para           1 Para              ROS: A ROS was performed and pertinent positives and negatives are included in the history.  GENERAL: No fevers or chills. HEENT: No change in vision, no earache, sore throat or sinus congestion. NECK: No pain or stiffness. CARDIOVASCULAR: No chest pain or pressure. No palpitations. PULMONARY: No shortness of breath, cough or wheeze. GASTROINTESTINAL: No abdominal pain, nausea, vomiting or diarrhea, melena or bright red blood per rectum. GENITOURINARY: No urinary frequency, urgency, hesitancy or dysuria. MUSCULOSKELETAL: No joint or muscle pain, no back pain, no recent trauma. DERMATOLOGIC: No rash, no itching, no lesions. ENDOCRINE: No polyuria, polydipsia, no heat or cold intolerance. No recent change in weight. HEMATOLOGICAL: No anemia or easy bruising or bleeding. NEUROLOGIC: No headache, seizures, numbness, tingling or weakness. PSYCHIATRIC: No depression, no loss of interest in normal activity or change  in sleep pattern.     Exam:   BP 124/76   Ht 5\' 3"  (1.6 m)   Wt 160 lb (72.6 kg)   BMI 28.34 kg/m   Body mass index is 28.34 kg/m.  General appearance : Well developed well nourished female. No acute distress HEENT: Eyes: no retinal hemorrhage or exudates,  Neck supple, trachea midline, no carotid bruits, no thyroidmegaly Lungs: Clear to auscultation, no rhonchi or wheezes, or rib retractions  Heart: Regular rate and rhythm, no murmurs or gallops Breast:Examined in sitting and supine position were symmetrical in appearance, no palpable masses or tenderness,  no skin retraction, no nipple inversion, no nipple discharge, no skin discoloration, no axillary or supraclavicular lymphadenopathy Abdomen: no palpable masses or tenderness, no rebound or guarding Extremities: no edema or skin discoloration or tenderness  Pelvic: Vulva: Normal             Vagina: No gross lesions or discharge  Cervix: No gross lesions or discharge.  Pap reflex done.  Uterus  AV, normal size, shape and consistency, non-tender and mobile  Adnexa  Without masses or tenderness  Anus: Normal   Assessment/Plan:  58 y.o. female for annual exam   1. Encounter for routine gynecological examination with Papanicolaou smear of cervix Normal gynecologic exam in menopause.  Pap reflex done, Pap done this year because of history of abnormal Pap test a few years ago.  Breast exam normal.  Last screening mammogram July 2019 was negative.  Colonoscopy in 2018.  Health labs at [facility].  2. H/O abnormal cervical Papanicolaou smear  3. Postmenopause Well on no hormone replacement therapy.  No postmenopausal bleeding.  4. Osteopenia of multiple sites Osteopenia on bone density May 2019.  Recommend vitamin D supplements, calcium intake of 1200 mg daily and regular weightbearing physical activities.  5. Overweight (BMI 25.0-29.9) Recommend a slightly lower calorie/carb diet such as [employer].   Aerobic physical activities 5 times a week and weightlifting every 2 days.  Other orders - PRESCRIPTION MEDICATION; Insulin injection- name?  Princess Bruins MD, 2:19 PM 01/06/2019

## 2019-01-07 NOTE — Addendum Note (Signed)
Addended by: Thurnell Garbe A on: 01/07/2019 10:29 AM   Modules accepted: Orders

## 2019-01-08 LAB — PAP IG W/ RFLX HPV ASCU

## 2019-02-10 ENCOUNTER — Other Ambulatory Visit: Payer: Self-pay

## 2019-02-10 ENCOUNTER — Ambulatory Visit
Admission: RE | Admit: 2019-02-10 | Discharge: 2019-02-10 | Disposition: A | Discharge status: Home / Self Care | Payer: BC Managed Care – PPO | Source: Ambulatory Visit | Attending: Obstetrics & Gynecology | Admitting: Obstetrics & Gynecology

## 2019-02-10 DIAGNOSIS — Z1231 Encounter for screening mammogram for malignant neoplasm of breast: Secondary | ICD-10-CM

## 2019-02-11 ENCOUNTER — Other Ambulatory Visit: Payer: Self-pay | Admitting: Obstetrics & Gynecology

## 2019-02-11 DIAGNOSIS — R928 Other abnormal and inconclusive findings on diagnostic imaging of breast: Secondary | ICD-10-CM

## 2019-02-12 ENCOUNTER — Other Ambulatory Visit: Payer: Self-pay

## 2019-02-12 ENCOUNTER — Ambulatory Visit
Admission: RE | Admit: 2019-02-12 | Discharge: 2019-02-12 | Disposition: A | Discharge status: Home / Self Care | Payer: BC Managed Care – PPO | Source: Ambulatory Visit | Attending: Obstetrics & Gynecology | Admitting: Obstetrics & Gynecology

## 2019-02-12 ENCOUNTER — Other Ambulatory Visit: Payer: Self-pay | Admitting: Obstetrics & Gynecology

## 2019-02-12 DIAGNOSIS — R928 Other abnormal and inconclusive findings on diagnostic imaging of breast: Secondary | ICD-10-CM

## 2019-04-27 ENCOUNTER — Encounter: Payer: Self-pay | Admitting: Gynecology

## 2019-06-13 ENCOUNTER — Emergency Department (HOSPITAL_COMMUNITY): Payer: BC Managed Care – PPO

## 2019-06-13 ENCOUNTER — Encounter (HOSPITAL_COMMUNITY): Payer: Self-pay | Admitting: *Deleted

## 2019-06-13 ENCOUNTER — Inpatient Hospital Stay (HOSPITAL_COMMUNITY)
Admission: EM | Admit: 2019-06-13 | Discharge: 2019-06-17 | DRG: 177 | Disposition: A | Discharge status: Home / Self Care | Payer: BC Managed Care – PPO | Attending: Internal Medicine | Admitting: Internal Medicine

## 2019-06-13 DIAGNOSIS — J1289 Other viral pneumonia: Secondary | ICD-10-CM | POA: Diagnosis present

## 2019-06-13 DIAGNOSIS — Z8249 Family history of ischemic heart disease and other diseases of the circulatory system: Secondary | ICD-10-CM

## 2019-06-13 DIAGNOSIS — I10 Essential (primary) hypertension: Secondary | ICD-10-CM | POA: Diagnosis present

## 2019-06-13 DIAGNOSIS — E1165 Type 2 diabetes mellitus with hyperglycemia: Secondary | ICD-10-CM | POA: Diagnosis not present

## 2019-06-13 DIAGNOSIS — D649 Anemia, unspecified: Secondary | ICD-10-CM | POA: Diagnosis present

## 2019-06-13 DIAGNOSIS — J45909 Unspecified asthma, uncomplicated: Secondary | ICD-10-CM | POA: Diagnosis present

## 2019-06-13 DIAGNOSIS — E1065 Type 1 diabetes mellitus with hyperglycemia: Secondary | ICD-10-CM | POA: Diagnosis present

## 2019-06-13 DIAGNOSIS — Z794 Long term (current) use of insulin: Secondary | ICD-10-CM

## 2019-06-13 DIAGNOSIS — R300 Dysuria: Secondary | ICD-10-CM | POA: Diagnosis present

## 2019-06-13 DIAGNOSIS — U071 COVID-19: Secondary | ICD-10-CM | POA: Diagnosis present

## 2019-06-13 DIAGNOSIS — E876 Hypokalemia: Secondary | ICD-10-CM | POA: Diagnosis present

## 2019-06-13 DIAGNOSIS — E785 Hyperlipidemia, unspecified: Secondary | ICD-10-CM | POA: Diagnosis present

## 2019-06-13 DIAGNOSIS — J1282 Pneumonia due to coronavirus disease 2019: Secondary | ICD-10-CM | POA: Diagnosis present

## 2019-06-13 DIAGNOSIS — J9601 Acute respiratory failure with hypoxia: Secondary | ICD-10-CM | POA: Diagnosis present

## 2019-06-13 DIAGNOSIS — R9389 Abnormal findings on diagnostic imaging of other specified body structures: Secondary | ICD-10-CM | POA: Diagnosis not present

## 2019-06-13 DIAGNOSIS — E109 Type 1 diabetes mellitus without complications: Secondary | ICD-10-CM | POA: Diagnosis present

## 2019-06-13 DIAGNOSIS — M8580 Other specified disorders of bone density and structure, unspecified site: Secondary | ICD-10-CM | POA: Diagnosis present

## 2019-06-13 LAB — COMPREHENSIVE METABOLIC PANEL
ALT: 23 U/L (ref 0–44)
AST: 23 U/L (ref 15–41)
Albumin: 3.6 g/dL (ref 3.5–5.0)
Alkaline Phosphatase: 80 U/L (ref 38–126)
Anion gap: 12 (ref 5–15)
BUN: 17 mg/dL (ref 6–20)
CO2: 24 mmol/L (ref 22–32)
Calcium: 9.3 mg/dL (ref 8.9–10.3)
Chloride: 103 mmol/L (ref 98–111)
Creatinine, Ser: 0.64 mg/dL (ref 0.44–1.00)
GFR calc Af Amer: >60 mL/min (ref >60)
GFR calc non Af Amer: >60 mL/min (ref >60)
Glucose, Bld: 218 mg/dL — ABNORMAL HIGH (ref 70–99)
Potassium: 3.1 mmol/L — ABNORMAL LOW (ref 3.5–5.1)
Sodium: 139 mmol/L (ref 135–145)
Total Bilirubin: 0.5 mg/dL (ref 0.3–1.2)
Total Protein: 7.6 g/dL (ref 6.5–8.1)

## 2019-06-13 LAB — CBC WITH DIFFERENTIAL/PLATELET
Abs Immature Granulocytes: 0.04 10*3/uL (ref 0.00–0.07)
Basophils Absolute: 0 10*3/uL (ref 0.0–0.1)
Basophils Relative: 0 %
Eosinophils Absolute: 0.1 10*3/uL (ref 0.0–0.5)
Eosinophils Relative: 1 %
HCT: 42.7 % (ref 36.0–46.0)
Hemoglobin: 13.7 g/dL (ref 12.0–15.0)
Immature Granulocytes: 1 %
Lymphocytes Relative: 18 %
Lymphs Abs: 1.5 10*3/uL (ref 0.7–4.0)
MCH: 28.6 pg (ref 26.0–34.0)
MCHC: 32.1 g/dL (ref 30.0–36.0)
MCV: 89.1 fL (ref 80.0–100.0)
Monocytes Absolute: 0.8 10*3/uL (ref 0.1–1.0)
Monocytes Relative: 9 %
Neutro Abs: 6 10*3/uL (ref 1.7–7.7)
Neutrophils Relative %: 71 %
Platelets: 474 10*3/uL — ABNORMAL HIGH (ref 150–400)
RBC: 4.79 MIL/uL (ref 3.87–5.11)
RDW: 12 % (ref 11.5–15.5)
WBC: 8.3 10*3/uL (ref 4.0–10.5)
nRBC: 0 % (ref 0.0–0.2)

## 2019-06-13 LAB — CBG MONITORING, ED
Glucose-Capillary: 60 mg/dL — ABNORMAL LOW (ref 70–99)
Glucose-Capillary: 129 mg/dL — ABNORMAL HIGH (ref 70–99)

## 2019-06-13 LAB — LACTATE DEHYDROGENASE: LDH: 273 U/L — ABNORMAL HIGH (ref 98–192)

## 2019-06-13 LAB — C-REACTIVE PROTEIN: CRP: 6.1 mg/dL — ABNORMAL HIGH (ref <1.0)

## 2019-06-13 LAB — PROCALCITONIN: Procalcitonin: <0.1 ng/mL

## 2019-06-13 LAB — TRIGLYCERIDES: Triglycerides: 104 mg/dL (ref <150)

## 2019-06-13 LAB — D-DIMER, QUANTITATIVE: D-Dimer, Quant: 0.67 ug/mL-FEU — ABNORMAL HIGH (ref 0.00–0.50)

## 2019-06-13 LAB — SARS CORONAVIRUS 2 BY RT PCR (HOSPITAL ORDER, PERFORMED IN ~~LOC~~ HOSPITAL LAB): SARS Coronavirus 2: POSITIVE — AB

## 2019-06-13 LAB — FERRITIN: Ferritin: 133 ng/mL (ref 11–307)

## 2019-06-13 LAB — LACTIC ACID, PLASMA: Lactic Acid, Venous: 1.9 mmol/L (ref 0.5–1.9)

## 2019-06-13 LAB — FIBRINOGEN: Fibrinogen: 755 mg/dL — ABNORMAL HIGH (ref 210–475)

## 2019-06-13 MED ORDER — ENOXAPARIN SODIUM 40 MG/0.4ML ~~LOC~~ SOLN
40.0000 mg | Freq: Every day | SUBCUTANEOUS | Status: DC
  Administered 2019-06-14 – 2019-06-17 (×4): 40 mg via SUBCUTANEOUS
  Filled 2019-06-13 (×4): qty 0.4

## 2019-06-13 MED ORDER — SODIUM CHLORIDE 0.9 % IV SOLN: 100.0000 mg | INTRAVENOUS | Status: DC

## 2019-06-13 MED ORDER — SODIUM CHLORIDE 0.9 % IV SOLN
200.0000 mg | Freq: Once | INTRAVENOUS | Status: AC
  Administered 2019-06-13: 200 mg via INTRAVENOUS
  Filled 2019-06-13: qty 40

## 2019-06-13 MED ORDER — GUAIFENESIN-DM 100-10 MG/5ML PO SYRP: 10.0000 mL | ORAL_SOLUTION | ORAL | Status: DC | PRN

## 2019-06-13 MED ORDER — LISINOPRIL 10 MG PO TABS
5.0000 mg | ORAL_TABLET | Freq: Every day | ORAL | Status: DC
  Administered 2019-06-14: 5 mg via ORAL
  Filled 2019-06-13 (×2): qty 1

## 2019-06-13 MED ORDER — ATORVASTATIN CALCIUM 40 MG PO TABS
40.0000 mg | ORAL_TABLET | Freq: Every day | ORAL | Status: DC
  Administered 2019-06-14 – 2019-06-16 (×3): 40 mg via ORAL
  Filled 2019-06-13 (×3): qty 1

## 2019-06-13 MED ORDER — BENZONATATE 100 MG PO CAPS
100.0000 mg | ORAL_CAPSULE | Freq: Three times a day (TID) | ORAL | Status: DC
  Administered 2019-06-13 – 2019-06-17 (×11): 100 mg via ORAL
  Filled 2019-06-13 (×11): qty 1

## 2019-06-13 MED ORDER — SODIUM CHLORIDE 0.9% FLUSH: 3.0000 mL | INTRAVENOUS | Status: DC | PRN

## 2019-06-13 MED ORDER — VITAMIN D 25 MCG (1000 UNIT) PO TABS
1000.0000 [IU] | ORAL_TABLET | Freq: Every day | ORAL | Status: DC
  Administered 2019-06-13 – 2019-06-17 (×5): 1000 [IU] via ORAL
  Filled 2019-06-13 (×5): qty 1

## 2019-06-13 MED ORDER — METFORMIN HCL 500 MG PO TABS
1000.0000 mg | ORAL_TABLET | Freq: Two times a day (BID) | ORAL | Status: DC
  Administered 2019-06-14: 1000 mg via ORAL
  Filled 2019-06-13: qty 2

## 2019-06-13 MED ORDER — ALBUTEROL SULFATE HFA 108 (90 BASE) MCG/ACT IN AERS
2.0000 | INHALATION_SPRAY | Freq: Four times a day (QID) | RESPIRATORY_TRACT | Status: DC
  Administered 2019-06-13 – 2019-06-17 (×16): 2 via RESPIRATORY_TRACT
  Filled 2019-06-13 (×2): qty 6.7

## 2019-06-13 MED ORDER — SODIUM CHLORIDE 0.9 % IV SOLN: 250.0000 mL | INTRAVENOUS | Status: DC | PRN

## 2019-06-13 MED ORDER — DEXAMETHASONE 6 MG PO TABS
6.0000 mg | ORAL_TABLET | ORAL | Status: DC
  Administered 2019-06-13 – 2019-06-16 (×4): 6 mg via ORAL
  Filled 2019-06-13 (×4): qty 1

## 2019-06-13 MED ORDER — GLIMEPIRIDE 4 MG PO TABS
4.0000 mg | ORAL_TABLET | Freq: Two times a day (BID) | ORAL | Status: DC
  Administered 2019-06-13 – 2019-06-14 (×2): 4 mg via ORAL
  Filled 2019-06-13 (×5): qty 1

## 2019-06-13 MED ORDER — POTASSIUM CHLORIDE 20 MEQ PO PACK
40.0000 meq | PACK | Freq: Once | ORAL | Status: AC
  Administered 2019-06-13: 40 meq via ORAL
  Filled 2019-06-13: qty 2

## 2019-06-13 MED ORDER — PANTOPRAZOLE SODIUM 40 MG PO TBEC
40.0000 mg | DELAYED_RELEASE_TABLET | Freq: Every day | ORAL | Status: DC
  Administered 2019-06-13 – 2019-06-17 (×5): 40 mg via ORAL
  Filled 2019-06-13 (×5): qty 1

## 2019-06-13 MED ORDER — VITAMIN C 500 MG PO TABS
500.0000 mg | ORAL_TABLET | Freq: Every day | ORAL | Status: DC
  Administered 2019-06-13 – 2019-06-17 (×5): 500 mg via ORAL
  Filled 2019-06-13 (×5): qty 1

## 2019-06-13 MED ORDER — INSULIN GLARGINE 100 UNIT/ML ~~LOC~~ SOLN
38.0000 [IU] | Freq: Every day | SUBCUTANEOUS | Status: DC
  Administered 2019-06-14 – 2019-06-17 (×4): 38 [IU] via SUBCUTANEOUS
  Filled 2019-06-13 (×5): qty 0.38

## 2019-06-13 MED ORDER — ZINC SULFATE 220 (50 ZN) MG PO CAPS
220.0000 mg | ORAL_CAPSULE | Freq: Every day | ORAL | Status: DC
  Administered 2019-06-13 – 2019-06-17 (×5): 220 mg via ORAL
  Filled 2019-06-13 (×5): qty 1

## 2019-06-13 MED ORDER — ASPIRIN EC 81 MG PO TBEC
81.0000 mg | DELAYED_RELEASE_TABLET | Freq: Every day | ORAL | Status: DC
  Administered 2019-06-13 – 2019-06-17 (×5): 81 mg via ORAL
  Filled 2019-06-13 (×5): qty 1

## 2019-06-13 MED ORDER — INSULIN ASPART 100 UNIT/ML ~~LOC~~ SOLN
0.0000 [IU] | Freq: Three times a day (TID) | SUBCUTANEOUS | Status: DC
  Administered 2019-06-14: 5 [IU] via SUBCUTANEOUS
  Filled 2019-06-13: qty 0.15

## 2019-06-13 MED ORDER — SODIUM CHLORIDE 0.9% FLUSH
3.0000 mL | Freq: Two times a day (BID) | INTRAVENOUS | Status: DC
  Administered 2019-06-13 – 2019-06-17 (×8): 3 mL via INTRAVENOUS

## 2019-06-13 NOTE — ED Notes (Signed)
Pt ambulated in room without distress, O2 sats dropped from 97 to 93 while walking.

## 2019-06-13 NOTE — H&P (Signed)
History and Physical  Hannah Cantrell [id number] DOB: [birth year] DOA: 06/13/2019  Referring physician:  Tedd Sias, PA     PCP: Chesley Noon, MD  Outpatient Specialists:  Patient coming from: Home & is able to ambulate   Chief Complaint: Shortness of breath  HPI: Hannah Cantrell is a 58 y.o. female with medical history significant for diabetes mellitus type 2, hypertension, hyperlipidemia, osteopenia asthma, who presented to the emergency department with shortness of breath that began yesterday and is worsening since to the extent that she is short of breath at rest patient stated that she worsened with exacerbation but endorses shortness of breath at rest periodically she is also on inhalers.  She denies chest pain headaches she does have fever with a T-max of 102 and a some cough she stated the fever improved with Tylenol and ibuprofen she also has body aches and fatigue loss of smell and taste was also positive.  She stated she was tested for Covid on November 6 which was positive..  She stated her husband has Covid and is admitted to [facility] as well.  She has also had some decreased appetite over the past 3 days and has not been motivated to drink or eat but she denies nausea vomiting or abdominal pain.  In the ED she was noted to desaturate to 90 to 93% when she ambulated but at rest she was up to 95 up to 100.  She was also noted to be tachycardic in the 100s and accelerates up to 125 when she stands up or moves around she was not in respiratory distress.  Her platelet was noted to be 474 potassium 3.1 glucose 218 D-dimer 0.67 LDL 236 273 fibrinogen 755 CRP 6.1 blood culture x2 pending chest x-ray showed bilateral airspace opacity consistent with Covid pneumonia  Review of Systems: Patient seen she denies nausea vomiting diarrhea or abdominal pain Or chest pain. Pt complains cough shortness of breath congestion ,fever body aches fatigue loss of  smell and loss of taste    Review of systems are otherwise as above   Past Medical History:  Diagnosis Date  . Diabetes mellitus without complication ([place])   . Hyperlipidemia   . Hypertension   . Osteopenia 11/2017   T score -1.5 FRAX 7.3% / 0.6%   Past Surgical History:  Procedure Laterality Date  . BREAST CYST ASPIRATION     1983    Social History:  reports that she has never smoked. She has never used smokeless tobacco. She reports that she does not drink alcohol. No history on file for drug.   No Known Allergies  Family History  Problem Relation Age of Onset  . Hypertension Father   . Cancer Maternal Uncle        colon  . Diabetes Maternal Grandfather   . Diabetes Paternal Grandmother       Prior to Admission medications   Medication Sig Start Date End Date Taking? Authorizing Provider  albuterol (PROVENTIL HFA;VENTOLIN HFA) 108 (90 Base) MCG/ACT inhaler Inhale into the lungs. 11/28/15 06/13/19 Yes [provider]  atorvastatin (LIPITOR) 40 MG tablet Take 40 mg by mouth daily.   Yes [provider]  benzonatate (TESSALON) 100 MG capsule Take 100 mg by mouth 3 (three) times daily. 06/04/19  Yes [provider]  glimepiride (AMARYL) 4 MG tablet Take 4 mg by mouth 2 (two) times daily.  06/29/16  Yes [provider]  Insulin Glargine-Lixisenatide (SOLIQUA) 100-33 UNT-MCG/ML SOPN  Inject 5-38 Units into the skin 2 (two) times daily as needed. 38 units every morning and prn 5 units in evening.   Yes [provider]  lisinopril (PRINIVIL,ZESTRIL) 5 MG tablet Take 5 mg by mouth daily.   Yes [provider]  metFORMIN (GLUCOPHAGE) 1000 MG tablet Take 1,000 mg by mouth 2 (two) times daily with a meal.  06/24/16  Yes [provider]  pantoprazole (PROTONIX) 40 MG tablet Take 40 mg by mouth daily as needed. 04/22/19  Yes [provider]    Physical Exam: BP 114/71 (BP Location: Left Arm)   Pulse (!) 108   Temp  98.2 F (36.8 C) (Oral)   Resp (!) 21   SpO2 93%   Exam:  . General: 58 y.o. year-old female well developed well nourished in no acute distress.  Alert and oriented x3. . Cardiovascular: Tachycardic with no rubs or gallops.  No thyromegaly or JVD noted.   Marland Kitchen Respiratory: Bilateral adventitious/heart sounds up to the mid lung.   with no wheezes or rales. Good inspiratory effort. . Abdomen: Soft nontender nondistended with normal bowel sounds x4 quadrants. . Musculoskeletal: No lower extremity edema. 2/4 pulses in all 4 extremities. . Skin: No ulcerative lesions noted or rashes, . Psychiatry: Mood is appropriate for condition and setting           Labs on Admission:  Basic Metabolic Panel: Recent Labs  Lab 06/13/19 1544  NA 139  K 3.1*  CL 103  CO2 24  GLUCOSE 218*  BUN 17  CREATININE 0.64  CALCIUM 9.3   Liver Function Tests: Recent Labs  Lab 06/13/19 1544  AST 23  ALT 23  ALKPHOS 80  BILITOT 0.5  PROT 7.6  ALBUMIN 3.6   No results for input(s): LIPASE, AMYLASE in the last 168 hours. No results for input(s): AMMONIA in the last 168 hours. CBC: Recent Labs  Lab 06/13/19 1544  WBC 8.3  NEUTROABS 6.0  HGB 13.7  HCT 42.7  MCV 89.1  PLT 474*   Cardiac Enzymes: No results for input(s): CKTOTAL, CKMB, CKMBINDEX, TROPONINI in the last 168 hours.  BNP (last 3 results) No results for input(s): BNP in the last 8760 hours.  ProBNP (last 3 results) No results for input(s): PROBNP in the last 8760 hours.  CBG: No results for input(s): GLUCAP in the last 168 hours.  Radiological Exams on Admission: Dg Chest Port 1 View  Result Date: 06/13/2019 CLINICAL DATA:  Shortness of breath EXAM: PORTABLE CHEST 1 VIEW COMPARISON:  None. FINDINGS: The heart size and mediastinal contours are within normal limits. There are subtle heterogeneous airspace opacities bilaterally. Somewhat more nodular opacity of the right upper lobe measuring approximately 8 mm in projection. The  visualized skeletal structures are unremarkable. IMPRESSION: 1. There are subtle heterogeneous airspace opacities bilaterally, concerning for infection. 2. Somewhat more nodular opacity of the right upper lobe measuring approximately 8 mm in projection, nonspecific. Consider CT to further evaluate. Electronically Signed   By: Eddie Candle M.D.   On: 06/13/2019 15:31    EKG: Per ER MD sinus tachycardia no ischemia  Assessment/Plan Present on Admission: . Pneumonia due to COVID-19 virus . Type 1 diabetes mellitus without complication ([place]) . Acute respiratory failure with hypoxemia ([place]) . Essential hypertension . Asthma in adult without complication  Principal Problem:   Pneumonia due to COVID-19 virus Active Problems:   Type 1 diabetes mellitus without complication ([place])   Acute respiratory failure with hypoxemia ([place])  Essential hypertension   Asthma in adult without complication Patient is a 58 year old female who presented with known Covid status that was tested positive for June 04, 2019 and with her husband being also positive and admitted to the hospital she presented with worsening symptoms with shortness of breath loss of smell and taste tachycardia.  During her ED stay she was noted to desaturate to 90% on room air upon ambulation O2 reduced to 93% chest x-ray showed bilateral airspace opacity consistent with Covid pneumonia  1. COVID-19 positive test (U07.1, COVID-19) with Acute Pneumonia (J12.89, Other viral pneumonia) (If respiratory failure or sepsis present, add as separate assessment)  .  Patient will be started on Decadron we will continue as needed albuterol MDI.  Consult for remdesivir has been put in for pharmacy.  2.  Acute respiratory failure with mild hypoxemia.  We will continue to monitor this is likely from her COVID-19 positive test (U07.1, COVID-19) with Acute Pneumonia (J12.89, Other viral pneumonia) (If respiratory failure or sepsis present, add as  separate assessment)  3.  Hypertension continue home medication  4.  Type 2 diabetes mellitus patient is on Lantus at bedtime as well as Metformin and glipizide we will continue those  5.  Hyperlipidemia continue atorvastatin  6.  Hypokalemia mild of 3.1 will replace with oral potassium    Severity of Illness: The appropriate patient status for this patient is INPATIENT. Inpatient status is judged to be reasonable and necessary in order to provide the required intensity of service to ensure the patient's safety. The patient's presenting symptoms, physical exam findings, and initial radiographic and laboratory data in the context of their chronic comorbidities is felt to place them at high risk for further clinical deterioration. Furthermore, it is not anticipated that the patient will be medically stable for discharge from the hospital within 2 midnights of admission. The following factors support the patient status of inpatient.   " The patient's presenting symptoms include respiratory distress with hypoxia. " The worrisome physical exam findings include hypoxia on exertion. " The initial radiographic and laboratory data are worrisome because of positive Covid pneumonia. " The chronic co-morbidities include diabetes asthma.   * I certify that at the point of admission it is my clinical judgment that the patient will require inpatient hospital care spanning beyond 2 midnights from the point of admission due to high intensity of service, high risk for further deterioration and high frequency of surveillance required.*    DVT prophylaxis: Lovenox aspirin  Code Status: Full  Family Communication: None at bedside  Disposition Plan: Patient will be admitted to [facility] for positive Covid pneumonia  Consults called: None  Admission status: Inpatient    Cristal Deer MD [facility] Pager [phone]  If 7PM-7AM, please contact night-coverage [email or web address] Password  [facility]  06/13/2019, 6:29 PM

## 2019-06-13 NOTE — ED Notes (Signed)
[facility] called for transport. 

## 2019-06-13 NOTE — ED Notes (Signed)
Pt's CBG was 60, pt given orange juice and graham crackers with peanut butter

## 2019-06-13 NOTE — ED Provider Notes (Signed)
[facility] DEPT Provider Note   CSN: [id number] Arrival date & time: 06/13/19  1351     History   Chief Complaint Chief Complaint  Patient presents with  . Shortness of Breath    COVID +    HPI Hannah Cantrell is a 58 y.o. female history of diabetes, hypertension, HLD    HPI  Patient is Covid positive and presents for shortness of breath that began yesterday and is worsened since to the extent that she is short of breath at rest. Patient states this worsens with exertion but also endorses short of breath at rest periodically. Denies chest pain, headache. Associated with cough, fevers T-max 102 at home improved with Tylenol and ibuprofen, also body aches, and fatigue.  Endorses loss of taste and smell.  Patient states 11/2 she started feeling tired and achy and was tested 11/6 for Covid which was positive.  Patient states that her husband was assessed and admitted to [facility].  Patient states she has had decreased appetite over the past 3 days and has not been motivated enough to eat or drink.  States that she has no nausea or vomiting or abdominal pain but does not want to eat or drink.    Past Medical History:  Diagnosis Date  . Diabetes mellitus without complication ([place])   . Hyperlipidemia   . Hypertension   . Osteopenia 11/2017   T score -1.5 FRAX 7.3% / 0.6%    Patient Active Problem List   Diagnosis Date Noted  . Trochanteric bursitis, left hip 08/05/2016    Past Surgical History:  Procedure Laterality Date  . BREAST CYST ASPIRATION     1983     OB History    Gravida  2   Para  2   Term      Preterm      AB      Living  2     SAB      TAB      Ectopic      Multiple      Live Births               Home Medications    Prior to Admission medications   Medication Sig Start Date End Date Taking? Authorizing Provider  albuterol (PROVENTIL HFA;VENTOLIN HFA) 108 (90 Base) MCG/ACT inhaler  Inhale into the lungs. 11/28/15 06/13/19 Yes [provider]  atorvastatin (LIPITOR) 40 MG tablet Take 40 mg by mouth daily.   Yes [provider]  benzonatate (TESSALON) 100 MG capsule Take 100 mg by mouth 3 (three) times daily. 06/04/19  Yes [provider]  glimepiride (AMARYL) 4 MG tablet Take 4 mg by mouth 2 (two) times daily.  06/29/16  Yes [provider]  Insulin Glargine-Lixisenatide (SOLIQUA) 100-33 UNT-MCG/ML SOPN Inject 5-38 Units into the skin 2 (two) times daily as needed. 38 units every morning and prn 5 units in evening.   Yes [provider]  lisinopril (PRINIVIL,ZESTRIL) 5 MG tablet Take 5 mg by mouth daily.   Yes [provider]  metFORMIN (GLUCOPHAGE) 1000 MG tablet Take 1,000 mg by mouth 2 (two) times daily with a meal.  06/24/16  Yes [provider]  pantoprazole (PROTONIX) 40 MG tablet Take 40 mg by mouth daily as needed. 04/22/19  Yes [provider]    Family History Family History  Problem Relation Age of Onset  . Hypertension Father   . Cancer Maternal Uncle  colon  . Diabetes Maternal Grandfather   . Diabetes Paternal Grandmother     Social History Social History   Tobacco Use  . Smoking status: Never Smoker  . Smokeless tobacco: Never Used  Substance Use Topics  . Alcohol use: Never    Frequency: Never  . Drug use: Not on file     Allergies   Patient has no known allergies.   Review of Systems Review of Systems  Constitutional: Positive for chills and fever.  HENT: Positive for congestion.   Respiratory: Positive for cough, chest tightness and shortness of breath.   Cardiovascular: Negative for chest pain, palpitations and leg swelling.  Gastrointestinal: Negative for abdominal pain, nausea and vomiting.  Genitourinary: Negative for dysuria.  Musculoskeletal: Positive for myalgias.  Skin: Negative for rash.  Psychiatric/Behavioral: Negative for confusion.  All other  systems reviewed and are negative.    Physical Exam Updated Vital Signs BP 114/71 (BP Location: Left Arm)   Pulse (!) 108   Temp 98.2 F (36.8 C) (Oral)   Resp (!) 21   SpO2 93%   Physical Exam Vitals signs and nursing note reviewed.  Constitutional:      General: She is not in acute distress.    Appearance: She is ill-appearing.     Comments: No acute distress, ill-appearing No acute respiratory distress  HENT:     Head: Normocephalic and atraumatic.     Nose: Nose normal.     Mouth/Throat:     Mouth: Mucous membranes are moist.  Eyes:     General: No scleral icterus. Neck:     Musculoskeletal: Normal range of motion.  Cardiovascular:     Rate and Rhythm: Regular rhythm. Tachycardia present.     Pulses: Normal pulses.     Heart sounds: Normal heart sounds.     Comments: Tachycardic at 110 at rest, accelerated to 125 standing up Pulmonary:     Effort: Pulmonary effort is normal. No respiratory distress.     Breath sounds: Rales (Bilateral bases) present.  Abdominal:     Palpations: Abdomen is soft.     Tenderness: There is no abdominal tenderness.  Musculoskeletal:     Right lower leg: No edema.     Left lower leg: No edema.  Skin:    General: Skin is warm and dry.     Capillary Refill: Capillary refill takes less than 2 seconds.  Neurological:     Mental Status: She is alert. Mental status is at baseline.  Psychiatric:        Mood and Affect: Mood normal.        Behavior: Behavior normal.      ED Treatments / Results  Labs (all labs ordered are listed, but only abnormal results are displayed) Labs Reviewed  CBC WITH DIFFERENTIAL/PLATELET - Abnormal; Notable for the following components:      Result Value   Platelets 474 (*)    All other components within normal limits  COMPREHENSIVE METABOLIC PANEL - Abnormal; Notable for the following components:   Potassium 3.1 (*)    Glucose, Bld 218 (*)    All other components within normal limits  D-DIMER,  QUANTITATIVE (NOT AT [facility]) - Abnormal; Notable for the following components:   D-Dimer, Quant 0.67 (*)    All other components within normal limits  LACTATE DEHYDROGENASE - Abnormal; Notable for the following components:   LDH 273 (*)    All other components within normal limits  FIBRINOGEN - Abnormal; Notable for the  following components:   Fibrinogen 755 (*)    All other components within normal limits  C-REACTIVE PROTEIN - Abnormal; Notable for the following components:   CRP 6.1 (*)    All other components within normal limits  CULTURE, BLOOD (ROUTINE X 2)  CULTURE, BLOOD (ROUTINE X 2)  SARS CORONAVIRUS 2 (TAT 6-24 HRS)  LACTIC ACID, PLASMA  PROCALCITONIN  FERRITIN  TRIGLYCERIDES  LACTIC ACID, PLASMA      EKG EKG Interpretation  Date/Time:  Sunday June 13 2019 14:10:45 EST Ventricular Rate:  111 PR Interval:    QRS Duration: 76 QT Interval:  322 QTC Calculation: 438 R Axis:   -11 Text Interpretation: Sinus tachycardia LVH by voltage Inferior infarct, old Confirmed by Dene Gentry [phone]) on 06/13/2019 3:08:35 PM   Radiology Dg Chest Port 1 View  Result Date: 06/13/2019 CLINICAL DATA:  Shortness of breath EXAM: PORTABLE CHEST 1 VIEW COMPARISON:  None. FINDINGS: The heart size and mediastinal contours are within normal limits. There are subtle heterogeneous airspace opacities bilaterally. Somewhat more nodular opacity of the right upper lobe measuring approximately 8 mm in projection. The visualized skeletal structures are unremarkable. IMPRESSION: 1. There are subtle heterogeneous airspace opacities bilaterally, concerning for infection. 2. Somewhat more nodular opacity of the right upper lobe measuring approximately 8 mm in projection, nonspecific. Consider CT to further evaluate. Electronically Signed   By: Eddie Candle M.D.   On: 06/13/2019 15:31    Procedures Procedures (including critical care time)  Medications Ordered in ED Medications - No data to  display   Initial Impression / Assessment and Plan / ED Course  I have reviewed the triage vital signs and the nursing notes.  Pertinent labs & imaging results that were available during my care of the patient were reviewed by me and considered in my medical decision making (see chart for details).        Patient is a 58 year old female presenting with known Covid status tested +11/6 symptoms since 11/2.  States that she has had worsening shortness of breath since yesterday.  Presented tachycardic and tachypneic to ED feeling short of breath at rest.  Worse with movement.  Desaturates to 90% during my physical exam.  Upon ambulation in the ED O2 sat stayed at 93% she was without distress however on my reassessment she continues to be tachypneic and ill-appearing.  Patient also with crackles in bilateral lung bases and chest x-ray with bilateral airspace opacities consistent with Covid pneumonia.  My attending physician discussed case with Dr. Kyung Bacca for admission who will accept care. Patient will be transferred to [place] for care. Requested repeat COVID test which is pending.   Covid admission labs ordered.  Blood cultures drawn.  EKG nonischemic.  Patient is consistently tachycardic--likely due to dehydration as she has had decreased p.o. intake but also likely due to underlying Covid infection.  Final Clinical Impressions(s) / ED Diagnoses   Final diagnoses:  [id number]    ED Discharge Orders    None       Tedd Sias, [place] 06/13/19 1735    Valarie Merino, MD 06/13/19 2125

## 2019-06-13 NOTE — ED Triage Notes (Addendum)
Patient arrived by EMS from home. Pt c/o SOB w/ exertion.   Pt has been positive since 06/04/2019/   Hx of DM    EMS VS HR 120  CBG 224 SpO2 96%

## 2019-06-14 ENCOUNTER — Encounter (HOSPITAL_COMMUNITY): Payer: Self-pay

## 2019-06-14 ENCOUNTER — Other Ambulatory Visit: Payer: Self-pay

## 2019-06-14 DIAGNOSIS — E876 Hypokalemia: Secondary | ICD-10-CM

## 2019-06-14 DIAGNOSIS — R9389 Abnormal findings on diagnostic imaging of other specified body structures: Secondary | ICD-10-CM

## 2019-06-14 DIAGNOSIS — E1165 Type 2 diabetes mellitus with hyperglycemia: Secondary | ICD-10-CM

## 2019-06-14 LAB — GLUCOSE, CAPILLARY
Glucose-Capillary: 209 mg/dL — ABNORMAL HIGH (ref 70–99)
Glucose-Capillary: 117 mg/dL — ABNORMAL HIGH (ref 70–99)
Glucose-Capillary: 71 mg/dL (ref 70–99)
Glucose-Capillary: 148 mg/dL — ABNORMAL HIGH (ref 70–99)

## 2019-06-14 MED ORDER — INSULIN ASPART 100 UNIT/ML ~~LOC~~ SOLN
4.0000 [IU] | Freq: Three times a day (TID) | SUBCUTANEOUS | Status: DC
  Administered 2019-06-15 – 2019-06-17 (×8): 4 [IU] via SUBCUTANEOUS

## 2019-06-14 MED ORDER — SODIUM CHLORIDE 0.9 % IV SOLN
100.0000 mg | Freq: Once | INTRAVENOUS | Status: AC
  Administered 2019-06-14: 100 mg via INTRAVENOUS
  Filled 2019-06-14: qty 100

## 2019-06-14 MED ORDER — INSULIN ASPART 100 UNIT/ML ~~LOC~~ SOLN
0.0000 [IU] | Freq: Three times a day (TID) | SUBCUTANEOUS | Status: DC
  Administered 2019-06-15: 09:00:00 11 [IU] via SUBCUTANEOUS
  Administered 2019-06-15: 4 [IU] via SUBCUTANEOUS
  Administered 2019-06-15: 7 [IU] via SUBCUTANEOUS
  Administered 2019-06-16: 4 [IU] via SUBCUTANEOUS
  Administered 2019-06-16 (×2): 11 [IU] via SUBCUTANEOUS
  Administered 2019-06-17: 15 [IU] via SUBCUTANEOUS
  Administered 2019-06-17: 7 [IU] via SUBCUTANEOUS

## 2019-06-14 MED ORDER — SODIUM CHLORIDE 0.9 % IV SOLN
100.0000 mg | INTRAVENOUS | Status: AC
  Administered 2019-06-15 – 2019-06-17 (×3): 100 mg via INTRAVENOUS
  Filled 2019-06-14 (×3): qty 100

## 2019-06-14 MED ORDER — INSULIN ASPART 100 UNIT/ML ~~LOC~~ SOLN: 0.0000 [IU] | Freq: Every day | SUBCUTANEOUS | Status: DC

## 2019-06-14 NOTE — Progress Notes (Signed)
PROGRESS NOTE  Hannah Cantrell [id number] DOB: 1961-04-14 DOA: 06/13/2019  PCP: Chesley Noon, MD  Brief History/Interval Summary: 58 y.o. female with medical history significant for diabetes mellitus type 2, hypertension, hyperlipidemia, osteopenia asthma, who presented to the emergency department with shortness of breath.  She was diagnosed with COVID-19 on November 6.  Her husband was also positive and was hospitalized at [facility].  Patient was found to have bilateral airspace disease on chest x-ray.  She was hospitalized for further management.   Reason for Visit: Pneumonia due to COVID-19  Consultants: None  Procedures: None  Antibiotics: Anti-infectives (From admission, onward)   Start     Dose/Rate Route Frequency Ordered Stop   06/15/19 1000  remdesivir 100 mg in sodium chloride 0.9 % 250 mL IVPB     100 mg 500 mL/hr over 30 Minutes Intravenous Every 24 hours 06/14/19 0014 06/18/19 0959   06/14/19 1900  remdesivir 100 mg in sodium chloride 0.9 % 250 mL IVPB  Status:  Discontinued     100 mg 500 mL/hr over 30 Minutes Intravenous Every 24 hours 06/13/19 1838 06/14/19 0014   06/14/19 1600  remdesivir 100 mg in sodium chloride 0.9 % 250 mL IVPB     100 mg 500 mL/hr over 30 Minutes Intravenous Once 06/14/19 0014     06/13/19 1900  remdesivir 200 mg in sodium chloride 0.9 % 250 mL IVPB     200 mg 500 mL/hr over 30 Minutes Intravenous Once 06/13/19 1838 06/13/19 2216      Subjective/Interval History: Patient states that she continues to have a cough.  Occasional nausea.  Denies any chest pain.  Denies any history of smoking.  Some shortness of breath with exertion.    Assessment/Plan:  Acute Hypoxic Resp. Failure/Pneumonia due to COVID-19  COVID-19 Labs  Recent Labs    06/13/19 1544 06/13/19 1547  DDIMER 0.67*  --   FERRITIN  --  133  LDH 273*  --   CRP  --  6.1*    Lab Results  Component Value Date   SARSCOV2NAA POSITIVE (A)  06/13/2019     Fever: Noted to be afebrile Oxygen requirements: Room air.  Saturating in the 90s. Antibacterials: None Remdesivir: Day 2 Steroids: Dexamethasone 6 mg daily Diuretics: None Actemra: Not given yet Vitamin C and Zinc: Continue DVT Prophylaxis:  Lovenox 40 milligrams daily  Patient's respiratory status is stable.  She is still dyspneic with exertion although she is saturating normal on room air at rest.  Continue with remdesivir and steroids.  CRP was elevated at 6.1 yesterday.  We will recheck labs tomorrow.  Continue Lovenox for DVT prophylaxis.  At this time there is no indication to consider Actemra.  We will hold off on convalescent plasma as well.  Prone positioning, mobilization and incentive spirometry.  Procalcitonin was less than 0.1.  Concern for right lung nodule Vague nodular opacity noted on chest x-ray.  Patient denies any history of smoking.  Proceed with CT scan of the chest.  Will order for tomorrow.  We will stop Metformin and recheck labs in the morning before ordering.  Essential hypertension Blood pressure is reasonably well controlled.  Continue with lisinopril.  Diabetes mellitus type 2, uncontrolled with hyperglycemia Patient on glimepiride Metformin and Soliqua at home.  No HbA1c in our system.  We will order one for tomorrow.  Continue SSI.  Continue Lantus.  Hold Metformin and glimepiride.  Hyperlipidemia Continue statin  Hypokalemia Potassium was repleted  yesterday.  Recheck labs tomorrow.  DVT Prophylaxis: Lovenox PUD Prophylaxis: Continue Protonix Code Status: Full code Family Communication: Discussed with patient and her husband Disposition Plan: Hopefully home when ready for discharge.   Medications:  Scheduled: . albuterol  2 puff Inhalation Q6H  . aspirin EC  81 mg Oral Daily  . atorvastatin  40 mg Oral q1800  . benzonatate  100 mg Oral TID  . cholecalciferol  1,000 Units Oral Daily  . dexamethasone  6 mg Oral Q24H  .  enoxaparin (LOVENOX) injection  40 mg Subcutaneous Daily  . glimepiride  4 mg Oral BID WC  . insulin aspart  0-15 Units Subcutaneous TID WC  . insulin glargine  38 Units Subcutaneous Daily  . lisinopril  5 mg Oral Daily  . metFORMIN  1,000 mg Oral BID WC  . pantoprazole  40 mg Oral Daily  . sodium chloride flush  3 mL Intravenous Q12H  . vitamin C  500 mg Oral Daily  . zinc sulfate  220 mg Oral Daily   Continuous: . sodium chloride    . remdesivir 100 mg in NS 250 mL    . [START ON 06/15/2019] remdesivir 100 mg in NS 250 mL     [id number] chloride, guaiFENesin-dextromethorphan, sodium chloride flush   Objective:  Vital Signs  Vitals:   06/13/19 2338 06/14/19 0022 06/14/19 0353 06/14/19 0745  BP: 124/76  104/70 111/79  Pulse: 100   84  Resp: 18  18 (!) 22  Temp: 98.8 F (37.1 C)  98.5 F (36.9 C) 98.2 F (36.8 C)  TempSrc: Oral  Oral Oral  SpO2: 97%  94% 95%  Weight:  68.9 kg    Height:  5\' 4"  (1.626 m)      Intake/Output Summary (Last 24 hours) at 06/14/2019 1156 Last data filed at 06/13/2019 2216 Gross per 24 hour  Intake 250 ml  Output -  Net 250 ml   Filed Weights   06/14/19 0022  Weight: 68.9 kg    General appearance: Awake alert.  In no distress Resp: Mildly tachypneic at rest.  Coarse breath sound bilaterally with crackles bilateral bases.  No wheezing or rhonchi. Cardio: S1-S2 is normal regular.  No S3-S4.  No rubs murmurs or bruit GI: Abdomen is soft.  Nontender nondistended.  Bowel sounds are present normal.  No masses organomegaly Extremities: No edema.  Full range of motion of lower extremities. Neurologic: Alert and oriented x3.  No focal neurological deficits.    Lab Results:  Data Reviewed: I have personally reviewed following labs and imaging studies  CBC: Recent Labs  Lab 06/13/19 1544  WBC 8.3  NEUTROABS 6.0  HGB 13.7  HCT 42.7  MCV 89.1  PLT 474*    Basic Metabolic Panel: Recent Labs  Lab 06/13/19 1544  NA 139  K 3.1*   CL 103  CO2 24  GLUCOSE 218*  BUN 17  CREATININE 0.64  CALCIUM 9.3    GFR: Estimated Creatinine Clearance: 73.1 mL/min (by C-G formula based on SCr of 0.64 mg/dL).  Liver Function Tests: Recent Labs  Lab 06/13/19 1544  AST 23  ALT 23  ALKPHOS 80  BILITOT 0.5  PROT 7.6  ALBUMIN 3.6    CBG: Recent Labs  Lab 06/13/19 2016 06/13/19 2136 06/14/19 0728  GLUCAP 60* 129* 209*    Lipid Profile: Recent Labs    06/13/19 1546  TRIG 104    Anemia Panel: Recent Labs    06/13/19 1547  FERRITIN 133  Recent Results (from the past 240 hour(s))  Blood Culture (routine x 2)     Status: None (Preliminary result)   Collection Time: 06/13/19  2:57 PM   Specimen: BLOOD  Result Value Ref Range Status   Specimen Description   Final    BLOOD RIGHT ANTECUBITAL Performed at [facility] [address], [place], [place] [address]    Special Requests   Final    BOTTLES DRAWN AEROBIC AND ANAEROBIC Blood Culture adequate volume Performed at [facility] [address], [place], [place] [address]    Culture   Final    NO GROWTH < 24 HOURS Performed at [facility] [address] [place], [place] [address]    Report Status PENDING  Incomplete  Blood Culture (routine x 2)     Status: None (Preliminary result)   Collection Time: 06/13/19  3:49 PM   Specimen: BLOOD  Result Value Ref Range Status   Specimen Description   Final    BLOOD LEFT ANTECUBITAL Performed at [facility] [address], [place], [place] [address]    Special Requests   Final    BOTTLES DRAWN AEROBIC AND ANAEROBIC Blood Culture results may not be optimal due to an inadequate volume of blood received in culture bottles Performed at [facility] [address] [place], [place] [address]    Culture   Final    NO GROWTH < 24 HOURS Performed at [facility] [address], [place], [place] [address]    Report  Status PENDING  Incomplete  SARS Coronavirus 2 by RT PCR (hospital order, performed in [facility] hospital lab) Nasopharyngeal Nasopharyngeal Swab     Status: Abnormal   Collection Time: 06/13/19  6:00 PM   Specimen: Nasopharyngeal Swab  Result Value Ref Range Status   SARS Coronavirus 2 POSITIVE (A) NEGATIVE Final    Comment: RESULT CALLED TO, READ BACK BY AND VERIFIED WITH: A.HODGES,RN [id number] @2100  BY V.WILKINS (NOTE) If result is NEGATIVE SARS-CoV-2 target nucleic acids are NOT DETECTED. The SARS-CoV-2 RNA is generally detectable in upper and lower  respiratory specimens during the acute phase of infection. The lowest  concentration of SARS-CoV-2 viral copies this assay can detect is 250  copies / mL. A negative result does not preclude SARS-CoV-2 infection  and should not be used as the sole basis for treatment or other  patient management decisions.  A negative result may occur with  improper specimen collection / handling, submission of specimen other  than nasopharyngeal swab, presence of viral mutation(s) within the  areas targeted by this assay, and inadequate number of viral copies  (<250 copies / mL). A negative result must be combined with clinical  observations, patient history, and epidemiological information. If result is POSITIVE SARS-CoV-2 target nucleic acids are DETECTED.  The SARS-CoV-2 RNA is generally detectable in upper and lower  respiratory specimens during the acute phase of infection.  Positive  results are indicative of active infection with SARS-CoV-2.  Clinical  correlation with patient history and other diagnostic information is  necessary to determine patient infection status.  Positive results do  not rule out bacterial infection or co-infection with other viruses. If result is PRESUMPTIVE POSTIVE SARS-CoV-2 nucleic acids MAY BE PRESENT.   A presumptive positive result was obtained on the submitted specimen  and confirmed on repeat testing.  While  2019 novel coronavirus  (SARS-CoV-2) nucleic acids may be present in the submitted  sample  additional confirmatory testing may be necessary for epidemiological  and / or clinical management purposes  to differentiate between  SARS-CoV-2 and other Sarbecovirus currently known to infect humans.  If clinically indicated additional testing with an alternate test  methodology [phone])  is advised. The SARS-CoV-2 RNA is generally  detectable in upper and lower respiratory specimens during the acute  phase of infection. The expected result is Negative. Fact Sheet for Patients:  StrictlyIdeas.no Fact Sheet for Healthcare Providers: BankingDealers.co.za This test is not yet approved or cleared by the [place] FDA and has been authorized for detection and/or diagnosis of SARS-CoV-2 by FDA under an Emergency Use Authorization (EUA).  This EUA will remain in effect (meaning this test can be used) for the duration of the COVID-19 declaration under Section 564(b)(1) of the Act, 21 U.S.C. section 360bbb-3(b)(1), unless the authorization is terminated or revoked sooner. Performed at [facility], [place] [address], [place], [place] [address]       Radiology Studies: Dg Chest Port 1 View  Result Date: 06/13/2019 CLINICAL DATA:  Shortness of breath EXAM: PORTABLE CHEST 1 VIEW COMPARISON:  None. FINDINGS: The heart size and mediastinal contours are within normal limits. There are subtle heterogeneous airspace opacities bilaterally. Somewhat more nodular opacity of the right upper lobe measuring approximately 8 mm in projection. The visualized skeletal structures are unremarkable. IMPRESSION: 1. There are subtle heterogeneous airspace opacities bilaterally, concerning for infection. 2. Somewhat more nodular opacity of the right upper lobe measuring approximately 8 mm in projection, nonspecific. Consider CT to further evaluate.  Electronically Signed   By: Eddie Candle M.D.   On: 06/13/2019 15:31       LOS: 1 day   [facility] Pager on [email or web address]  06/14/2019, 11:56 AM

## 2019-06-14 NOTE — Plan of Care (Signed)
Pt progressing towards goal as expected  

## 2019-06-15 ENCOUNTER — Inpatient Hospital Stay (HOSPITAL_COMMUNITY): Payer: BC Managed Care – PPO

## 2019-06-15 LAB — COMPREHENSIVE METABOLIC PANEL
ALT: 16 U/L (ref 0–44)
AST: 18 U/L (ref 15–41)
Albumin: 3 g/dL — ABNORMAL LOW (ref 3.5–5.0)
Alkaline Phosphatase: 67 U/L (ref 38–126)
Anion gap: 11 (ref 5–15)
BUN: 15 mg/dL (ref 6–20)
CO2: 25 mmol/L (ref 22–32)
Calcium: 9 mg/dL (ref 8.9–10.3)
Chloride: 104 mmol/L (ref 98–111)
Creatinine, Ser: 0.67 mg/dL (ref 0.44–1.00)
GFR calc Af Amer: >60 mL/min (ref >60)
GFR calc non Af Amer: >60 mL/min (ref >60)
Glucose, Bld: 187 mg/dL — ABNORMAL HIGH (ref 70–99)
Potassium: 4.1 mmol/L (ref 3.5–5.1)
Sodium: 140 mmol/L (ref 135–145)
Total Bilirubin: 0.6 mg/dL (ref 0.3–1.2)
Total Protein: 6.2 g/dL — ABNORMAL LOW (ref 6.5–8.1)

## 2019-06-15 LAB — CBC WITH DIFFERENTIAL/PLATELET
Abs Immature Granulocytes: 0.02 10*3/uL (ref 0.00–0.07)
Basophils Absolute: 0 10*3/uL (ref 0.0–0.1)
Basophils Relative: 0 %
Eosinophils Absolute: 0.1 10*3/uL (ref 0.0–0.5)
Eosinophils Relative: 1 %
HCT: 36 % (ref 36.0–46.0)
Hemoglobin: 11.3 g/dL — ABNORMAL LOW (ref 12.0–15.0)
Immature Granulocytes: 0 %
Lymphocytes Relative: 19 %
Lymphs Abs: 1.4 10*3/uL (ref 0.7–4.0)
MCH: 28.5 pg (ref 26.0–34.0)
MCHC: 31.4 g/dL (ref 30.0–36.0)
MCV: 90.7 fL (ref 80.0–100.0)
Monocytes Absolute: 0.7 10*3/uL (ref 0.1–1.0)
Monocytes Relative: 10 %
Neutro Abs: 5.3 10*3/uL (ref 1.7–7.7)
Neutrophils Relative %: 70 %
Platelets: 489 10*3/uL — ABNORMAL HIGH (ref 150–400)
RBC: 3.97 MIL/uL (ref 3.87–5.11)
RDW: 12.2 % (ref 11.5–15.5)
WBC: 7.6 10*3/uL (ref 4.0–10.5)
nRBC: 0 % (ref 0.0–0.2)

## 2019-06-15 LAB — URINALYSIS, ROUTINE W REFLEX MICROSCOPIC
Bilirubin Urine: NEGATIVE
Glucose, UA: 50 mg/dL — AB
Ketones, ur: NEGATIVE mg/dL
Nitrite: NEGATIVE
Protein, ur: 100 mg/dL — AB
RBC / HPF: >50 RBC/hpf — ABNORMAL HIGH (ref 0–5)
Specific Gravity, Urine: 1.044 — ABNORMAL HIGH (ref 1.005–1.030)
WBC, UA: >50 WBC/hpf — ABNORMAL HIGH (ref 0–5)
pH: 5 (ref 5.0–8.0)

## 2019-06-15 LAB — MAGNESIUM: Magnesium: 1.9 mg/dL (ref 1.7–2.4)

## 2019-06-15 LAB — D-DIMER, QUANTITATIVE: D-Dimer, Quant: 0.59 ug/mL-FEU — ABNORMAL HIGH (ref 0.00–0.50)

## 2019-06-15 LAB — HEMOGLOBIN A1C
Hgb A1c MFr Bld: 8.4 % — ABNORMAL HIGH (ref 4.8–5.6)
Mean Plasma Glucose: 194.38 mg/dL

## 2019-06-15 LAB — C-REACTIVE PROTEIN: CRP: 2.9 mg/dL — ABNORMAL HIGH (ref <1.0)

## 2019-06-15 LAB — GLUCOSE, CAPILLARY
Glucose-Capillary: 286 mg/dL — ABNORMAL HIGH (ref 70–99)
Glucose-Capillary: 159 mg/dL — ABNORMAL HIGH (ref 70–99)
Glucose-Capillary: 203 mg/dL — ABNORMAL HIGH (ref 70–99)
Glucose-Capillary: 151 mg/dL — ABNORMAL HIGH (ref 70–99)

## 2019-06-15 MED ORDER — LIP MEDEX EX OINT
1.0000 "application " | TOPICAL_OINTMENT | CUTANEOUS | Status: DC | PRN
  Filled 2019-06-15: qty 7

## 2019-06-15 MED ORDER — SODIUM CHLORIDE 0.9 % IV SOLN
1.0000 g | INTRAVENOUS | Status: DC
  Administered 2019-06-15 – 2019-06-16 (×2): 1 g via INTRAVENOUS
  Filled 2019-06-15: qty 10
  Filled 2019-06-15: qty 1

## 2019-06-15 MED ORDER — IOHEXOL 350 MG/ML SOLN
80.0000 mL | Freq: Once | INTRAVENOUS | Status: AC | PRN
  Administered 2019-06-15: 80 mL via INTRAVENOUS

## 2019-06-15 NOTE — Plan of Care (Signed)
Pt updated on POC, verbalized understanding

## 2019-06-15 NOTE — Progress Notes (Addendum)
PROGRESS NOTE  Hannah Cantrell [id number] DOB: [birth year] DOA: 06/13/2019  PCP: Chesley Noon, MD  Brief History/Interval Summary: 58 y.o. female with medical history significant for diabetes mellitus type 2, hypertension, hyperlipidemia, osteopenia asthma, who presented to the emergency department with shortness of breath.  She was diagnosed with COVID-19 on November 6.  Her husband was also positive and was hospitalized at [facility].  Patient was found to have bilateral airspace disease on chest x-ray.  She was hospitalized for further management.   Reason for Visit: Pneumonia due to COVID-19  Consultants: None  Procedures: None  Antibiotics: Anti-infectives (From admission, onward)   Start     Dose/Rate Route Frequency Ordered Stop   06/15/19 1000  remdesivir 100 mg in sodium chloride 0.9 % 250 mL IVPB     100 mg 500 mL/hr over 30 Minutes Intravenous Every 24 hours 06/14/19 0014 06/18/19 0959   06/14/19 1900  remdesivir 100 mg in sodium chloride 0.9 % 250 mL IVPB  Status:  Discontinued     100 mg 500 mL/hr over 30 Minutes Intravenous Every 24 hours 06/13/19 1838 06/14/19 0014   06/14/19 1600  remdesivir 100 mg in sodium chloride 0.9 % 250 mL IVPB     100 mg 500 mL/hr over 30 Minutes Intravenous Once 06/14/19 0014 06/15/19 0700   06/13/19 1900  remdesivir 200 mg in sodium chloride 0.9 % 250 mL IVPB     200 mg 500 mL/hr over 30 Minutes Intravenous Once 06/13/19 1838 06/13/19 2216      Subjective/Interval History: Patient states that she is feeling slightly better.  Continues to have a dry cough.  Some shortness of breath.  No chest pain.  Anxious about the CT scan planned for today.  Patient was in her husband's room.    Assessment/Plan:  Acute Hypoxic Resp. Failure/Pneumonia due to COVID-19  COVID-19 Labs  Recent Labs    06/13/19 1544 06/13/19 1547 06/15/19 0015  DDIMER 0.67*  --  0.59*  FERRITIN  --  133  --   LDH 273*  --   --   CRP  --   6.1* 2.9*    Lab Results  Component Value Date   SARSCOV2NAA POSITIVE (A) 06/13/2019     Fever: Remains afebrile Oxygen requirements: Room air.  Saturating in the 90s. Antibacterials: None Remdesivir: Day 3 Steroids: Dexamethasone 6 mg daily Diuretics: None Actemra: Not given yet Vitamin C and Zinc: Continue DVT Prophylaxis:  Lovenox 40 milligrams daily  Patient's respiratory status is stable.  Slightly dyspneic with minimal exertion.  Continues to have a dry cough.  Inflammatory markers have improved with CRP down to 2.9.  Continue with remdesivir and steroids.  Hold off on Actemra and convalescent plasma.  Continue with incentive spirometry mobilization and prone positioning.  Procalcitonin was less than 0.1.  Concern for right lung nodule Vague nodular opacity noted on chest x-ray.  Patient denies any history of smoking.  CT scan was ordered.  No PE noted.  No mention of any nodular lesions.  Bilateral patchy opacities noted.  Patient was reassured.  Will recommend chest x-ray in 4 to 6 weeks after pneumonia has cleared up.  Dysuria Patient mentioned that she is noticed some burning sensation with urinating.  A UA has been ordered.  ADDENDUM UA noted to be abnormal.  Small leukocytes.  More than 50 RBCs.  More than 50 WBC.  Mild protein.  We will start her on ceftriaxone.  Urine culture.  Essential  hypertension Blood pressure is reasonably well controlled.  Continue with lisinopril.  Diabetes mellitus type 2, uncontrolled with hyperglycemia Patient on glimepiride Metformin and Soliqua at home.  HbA1c 8.4.  Continue SSI.  Continue Lantus.  Holding oral medications.    Hyperlipidemia Continue statin.  LFTs are normal.  Hypokalemia Potassium is normal today.  Normocytic anemia No evidence of overt bleeding.  Continue to monitor periodically.  DVT Prophylaxis: Lovenox PUD Prophylaxis: Continue Protonix Code Status: Full code Family Communication: Discussed with patient  and her husband Disposition Plan: Hopefully home when ready for discharge.  Continue to mobilize.   Medications:  Scheduled: . albuterol  2 puff Inhalation Q6H  . aspirin EC  81 mg Oral Daily  . atorvastatin  40 mg Oral q1800  . benzonatate  100 mg Oral TID  . cholecalciferol  1,000 Units Oral Daily  . dexamethasone  6 mg Oral Q24H  . enoxaparin (LOVENOX) injection  40 mg Subcutaneous Daily  . insulin aspart  0-20 Units Subcutaneous TID WC  . insulin aspart  0-5 Units Subcutaneous QHS  . insulin aspart  4 Units Subcutaneous TID WC  . insulin glargine  38 Units Subcutaneous Daily  . pantoprazole  40 mg Oral Daily  . sodium chloride flush  3 mL Intravenous Q12H  . vitamin C  500 mg Oral Daily  . zinc sulfate  220 mg Oral Daily   Continuous: . sodium chloride    . remdesivir 100 mg in NS 250 mL Stopped (06/15/19 1117)   [id number] chloride, guaiFENesin-dextromethorphan, sodium chloride flush   Objective:  Vital Signs  Vitals:   06/14/19 1605 06/14/19 2015 06/15/19 0405 06/15/19 0733  BP: 115/73 107/63 114/74 123/76  Pulse: 92 92 70   Resp: 18 19 (!) 21 18  Temp: 98.7 F (37.1 C) 98.1 F (36.7 C) 98 F (36.7 C) 97.6 F (36.4 C)  TempSrc: Oral Oral Oral Oral  SpO2: 98% 96% 93% 98%  Weight:      Height:        Intake/Output Summary (Last 24 hours) at 06/15/2019 1302 Last data filed at 06/15/2019 1200 Gross per 24 hour  Intake 1023.97 ml  Output -  Net 1023.97 ml   Filed Weights   06/14/19 0022  Weight: 68.9 kg    General appearance: Awake alert.  In no distress Resp: Normal effort at rest this morning.  Coarse breath sound bilaterally with crackles.  No wheezing or rhonchi.   Cardio: S1-S2 is normal regular.  No S3-S4.  No rubs murmurs or bruit GI: Abdomen is soft.  Nontender nondistended.  Bowel sounds are present normal.  No masses organomegaly Extremities: No edema.  Full range of motion of lower extremities. Neurologic: Alert and oriented x3.  No focal  neurological deficits.    Lab Results:  Data Reviewed: I have personally reviewed following labs and imaging studies  CBC: Recent Labs  Lab 06/13/19 1544 06/15/19 0015  WBC 8.3 7.6  NEUTROABS 6.0 5.3  HGB 13.7 11.3*  HCT 42.7 36.0  MCV 89.1 90.7  PLT 474* 489*    Basic Metabolic Panel: Recent Labs  Lab 06/13/19 1544 06/15/19 0015  NA 139 140  K 3.1* 4.1  CL 103 104  CO2 24 25  GLUCOSE 218* 187*  BUN 17 15  CREATININE 0.64 0.67  CALCIUM 9.3 9.0  MG  --  1.9    GFR: Estimated Creatinine Clearance: 73.1 mL/min (by C-G formula based on SCr of 0.67 mg/dL).  Liver Function Tests:  Recent Labs  Lab 06/13/19 1544 06/15/19 0015  AST 23 18  ALT 23 16  ALKPHOS 80 67  BILITOT 0.5 0.6  PROT 7.6 6.2*  ALBUMIN 3.6 3.0*    CBG: Recent Labs  Lab 06/14/19 1124 06/14/19 1626 06/14/19 2013 06/15/19 0730 06/15/19 1158  GLUCAP 117* 71 148* 286* 159*    Lipid Profile: Recent Labs    06/13/19 1546  TRIG 104    Anemia Panel: Recent Labs    06/13/19 1547  FERRITIN 133    Recent Results (from the past 240 hour(s))  Blood Culture (routine x 2)     Status: None (Preliminary result)   Collection Time: 06/13/19  2:57 PM   Specimen: BLOOD  Result Value Ref Range Status   Specimen Description   Final    BLOOD RIGHT ANTECUBITAL Performed at [facility], [place] [address], [place], [place] [address]    Special Requests   Final    BOTTLES DRAWN AEROBIC AND ANAEROBIC Blood Culture adequate volume Performed at [facility] [address] [place], [place] [address]    Culture   Final    NO GROWTH 2 DAYS Performed at [facility] [address] [place], [place] [address]    Report Status PENDING  Incomplete  Blood Culture (routine x 2)     Status: None (Preliminary result)   Collection Time: 06/13/19  3:49 PM   Specimen: BLOOD  Result Value Ref Range Status   Specimen Description   Final    BLOOD LEFT ANTECUBITAL  Performed at [facility] [address] [place], [place] [address]    Special Requests   Final    BOTTLES DRAWN AEROBIC AND ANAEROBIC Blood Culture results may not be optimal due to an inadequate volume of blood received in culture bottles Performed at [facility] [address] [place], [place] [address]    Culture   Final    NO GROWTH 2 DAYS Performed at [facility] [address], [place], [place] [address]    Report Status PENDING  Incomplete  SARS Coronavirus 2 by RT PCR (hospital order, performed in [facility] hospital lab) Nasopharyngeal Nasopharyngeal Swab     Status: Abnormal   Collection Time: 06/13/19  6:00 PM   Specimen: Nasopharyngeal Swab  Result Value Ref Range Status   SARS Coronavirus 2 POSITIVE (A) NEGATIVE Final    Comment: RESULT CALLED TO, READ BACK BY AND VERIFIED WITH: A.HODGES,RN [id number] @2100  BY V.WILKINS (NOTE) If result is NEGATIVE SARS-CoV-2 target nucleic acids are NOT DETECTED. The SARS-CoV-2 RNA is generally detectable in upper and lower  respiratory specimens during the acute phase of infection. The lowest  concentration of SARS-CoV-2 viral copies this assay can detect is 250  copies / mL. A negative result does not preclude SARS-CoV-2 infection  and should not be used as the sole basis for treatment or other  patient management decisions.  A negative result may occur with  improper specimen collection / handling, submission of specimen other  than nasopharyngeal swab, presence of viral mutation(s) within the  areas targeted by this assay, and inadequate number of viral copies  (<250 copies / mL). A negative result must be combined with clinical  observations, patient history, and epidemiological information. If result is POSITIVE SARS-CoV-2 target nucleic acids are DETECTED.  The SARS-CoV-2 RNA is generally detectable in upper and lower  respiratory specimens during the acute phase of infection.  Positive  results are indicative of active infection with SARS-CoV-2.  Clinical  correlation with patient history and other diagnostic information is  necessary to determine patient infection status.  Positive results do  not rule out bacterial infection or co-infection with other viruses. If result is PRESUMPTIVE POSTIVE SARS-CoV-2 nucleic acids MAY BE PRESENT.   A presumptive positive result was obtained on the submitted specimen  and confirmed on repeat testing.  While 2019 novel coronavirus  (SARS-CoV-2) nucleic acids may be present in the submitted sample  additional confirmatory testing may be necessary for epidemiological  and / or clinical management purposes  to differentiate between  SARS-CoV-2 and other Sarbecovirus currently known to infect humans.  If clinically indicated additional testing with an alternate test  methodology [phone])  is advised. The SARS-CoV-2 RNA is generally  detectable in upper and lower respiratory specimens during the acute  phase of infection. The expected result is Negative. Fact Sheet for Patients:  StrictlyIdeas.no Fact Sheet for Healthcare Providers: BankingDealers.co.za This test is not yet approved or cleared by the [place] FDA and has been authorized for detection and/or diagnosis of SARS-CoV-2 by FDA under an Emergency Use Authorization (EUA).  This EUA will remain in effect (meaning this test can be used) for the duration of the COVID-19 declaration under Section 564(b)(1) of the Act, 21 U.S.C. section 360bbb-3(b)(1), unless the authorization is terminated or revoked sooner. Performed at [facility], [place] [address] [place], [place] [address]       Radiology Studies: Ct Chest W Contrast  Result Date: 06/15/2019 CLINICAL DATA:  58 year old female with lung nodule. EXAM: CT CHEST WITH CONTRAST TECHNIQUE: Multidetector CT imaging of the chest was performed during  intravenous contrast administration. CONTRAST:  19mL OMNIPAQUE IOHEXOL 350 MG/ML SOLN COMPARISON:  Chest radiograph dated 06/13/2019 FINDINGS: Cardiovascular: There is no cardiomegaly or pericardial effusion. The thoracic aorta is unremarkable. The origins of the great vessels of the aortic arch appear patent. No pulmonary artery embolus identified. Mediastinum/Nodes: No hilar or mediastinal adenopathy. The esophagus and the thyroid gland are grossly unremarkable. No mediastinal fluid collection. Lungs/Pleura: Bilateral peripheral and subpleural patchy and clusters of ground-glass densities most consistent with multifocal pneumonia, likely of atypical or viral etiology. There are a spectrum of findings in the lungs which can be seen with acute atypical infection (as well as other non-infectious etiologies). In particular, viral pneumonia (including COVID-19) should be considered in the appropriate clinical setting. There is no pleural effusion or pneumothorax. The central airways are patent. Upper Abdomen: Fatty liver. The visualized upper abdomen otherwise unremarkable. Musculoskeletal: No chest wall abnormality. No acute or significant osseous findings. IMPRESSION: 1. No CT evidence of pulmonary embolism. 2. Bilateral peripheral and subpleural patchy and clusters of ground-glass densities most consistent with multifocal pneumonia, likely of atypical or viral etiology. In particular, viral pneumonia (including COVID) should be considered in the appropriate clinical setting. Electronically Signed   By: Anner Crete M.D.   On: 06/15/2019 12:22   Dg Chest Port 1 View  Result Date: 06/13/2019 CLINICAL DATA:  Shortness of breath EXAM: PORTABLE CHEST 1 VIEW COMPARISON:  None. FINDINGS: The heart size and mediastinal contours are within normal limits. There are subtle heterogeneous airspace opacities bilaterally. Somewhat more nodular opacity of the right upper lobe measuring approximately 8 mm in projection. The  visualized skeletal structures are unremarkable. IMPRESSION: 1. There are subtle heterogeneous airspace opacities bilaterally, concerning for infection. 2. Somewhat more nodular opacity of the right upper lobe measuring approximately  8 mm in projection, nonspecific. Consider CT to further evaluate. Electronically Signed   By: Eddie Candle M.D.   On: 06/13/2019 15:31       LOS: 2 days   [facility] Pager on [email or web address]  06/15/2019, 1:02 PM

## 2019-06-16 DIAGNOSIS — I10 Essential (primary) hypertension: Secondary | ICD-10-CM

## 2019-06-16 DIAGNOSIS — J1289 Other viral pneumonia: Secondary | ICD-10-CM

## 2019-06-16 DIAGNOSIS — U071 COVID-19: Principal | ICD-10-CM

## 2019-06-16 LAB — COMPREHENSIVE METABOLIC PANEL
ALT: 17 U/L (ref 0–44)
AST: 14 U/L — ABNORMAL LOW (ref 15–41)
Albumin: 3.1 g/dL — ABNORMAL LOW (ref 3.5–5.0)
Alkaline Phosphatase: 66 U/L (ref 38–126)
Anion gap: 10 (ref 5–15)
BUN: 20 mg/dL (ref 6–20)
CO2: 23 mmol/L (ref 22–32)
Calcium: 9 mg/dL (ref 8.9–10.3)
Chloride: 105 mmol/L (ref 98–111)
Creatinine, Ser: 0.55 mg/dL (ref 0.44–1.00)
GFR calc Af Amer: >60 mL/min (ref >60)
GFR calc non Af Amer: >60 mL/min (ref >60)
Glucose, Bld: 294 mg/dL — ABNORMAL HIGH (ref 70–99)
Potassium: 4.4 mmol/L (ref 3.5–5.1)
Sodium: 138 mmol/L (ref 135–145)
Total Bilirubin: 0.5 mg/dL (ref 0.3–1.2)
Total Protein: 6.3 g/dL — ABNORMAL LOW (ref 6.5–8.1)

## 2019-06-16 LAB — CBC WITH DIFFERENTIAL/PLATELET
Abs Immature Granulocytes: 0.04 10*3/uL (ref 0.00–0.07)
Basophils Absolute: 0 10*3/uL (ref 0.0–0.1)
Basophils Relative: 0 %
Eosinophils Absolute: 0 10*3/uL (ref 0.0–0.5)
Eosinophils Relative: 0 %
HCT: 35.1 % — ABNORMAL LOW (ref 36.0–46.0)
Hemoglobin: 11.2 g/dL — ABNORMAL LOW (ref 12.0–15.0)
Immature Granulocytes: 1 %
Lymphocytes Relative: 18 %
Lymphs Abs: 1.2 10*3/uL (ref 0.7–4.0)
MCH: 28.4 pg (ref 26.0–34.0)
MCHC: 31.9 g/dL (ref 30.0–36.0)
MCV: 89.1 fL (ref 80.0–100.0)
Monocytes Absolute: 0.4 10*3/uL (ref 0.1–1.0)
Monocytes Relative: 6 %
Neutro Abs: 4.9 10*3/uL (ref 1.7–7.7)
Neutrophils Relative %: 75 %
Platelets: 518 10*3/uL — ABNORMAL HIGH (ref 150–400)
RBC: 3.94 MIL/uL (ref 3.87–5.11)
RDW: 12 % (ref 11.5–15.5)
WBC: 6.6 10*3/uL (ref 4.0–10.5)
nRBC: 0 % (ref 0.0–0.2)

## 2019-06-16 LAB — D-DIMER, QUANTITATIVE: D-Dimer, Quant: 0.42 ug/mL-FEU (ref 0.00–0.50)

## 2019-06-16 LAB — GLUCOSE, CAPILLARY
Glucose-Capillary: 273 mg/dL — ABNORMAL HIGH (ref 70–99)
Glucose-Capillary: 270 mg/dL — ABNORMAL HIGH (ref 70–99)
Glucose-Capillary: 177 mg/dL — ABNORMAL HIGH (ref 70–99)
Glucose-Capillary: 178 mg/dL — ABNORMAL HIGH (ref 70–99)

## 2019-06-16 LAB — URINE CULTURE: Culture: <10000 — AB

## 2019-06-16 LAB — C-REACTIVE PROTEIN: CRP: 1.1 mg/dL — ABNORMAL HIGH (ref <1.0)

## 2019-06-16 NOTE — Progress Notes (Signed)
PROGRESS NOTE  Hannah Cantrell [id number] DOB: 1961-04-26 DOA: 06/13/2019  PCP: Chesley Noon, MD  Brief History/Interval Summary: 58 y.o. female with medical history significant for diabetes mellitus type 2, hypertension, hyperlipidemia, osteopenia asthma, who presented to the emergency department with shortness of breath.  She was diagnosed with COVID-19 on November 6.  Her husband was also positive and was hospitalized at [facility].  Patient was found to have bilateral airspace disease on chest x-ray.  She was hospitalized for further management.   Subjective/Interval History: Reports dyspnea has improved, still with some dry cough, dysuria has improved as well.    Reason for Visit: Pneumonia due to COVID-19  Consultants: None  Procedures: None  Antibiotics: Anti-infectives (From admission, onward)   Start     Dose/Rate Route Frequency Ordered Stop   06/15/19 1800  cefTRIAXone (ROCEPHIN) 1 g in sodium chloride 0.9 % 100 mL IVPB     1 g 200 mL/hr over 30 Minutes Intravenous Every 24 hours 06/15/19 1701     06/15/19 1000  remdesivir 100 mg in sodium chloride 0.9 % 250 mL IVPB     100 mg 500 mL/hr over 30 Minutes Intravenous Every 24 hours 06/14/19 0014 06/18/19 0959   06/14/19 1900  remdesivir 100 mg in sodium chloride 0.9 % 250 mL IVPB  Status:  Discontinued     100 mg 500 mL/hr over 30 Minutes Intravenous Every 24 hours 06/13/19 1838 06/14/19 0014   06/14/19 1600  remdesivir 100 mg in sodium chloride 0.9 % 250 mL IVPB     100 mg 500 mL/hr over 30 Minutes Intravenous Once 06/14/19 0014 06/15/19 0700   06/13/19 1900  remdesivir 200 mg in sodium chloride 0.9 % 250 mL IVPB     200 mg 500 mL/hr over 30 Minutes Intravenous Once 06/13/19 1838 06/13/19 2216        Assessment/Plan:  Acute Hypoxic Resp. Failure/Pneumonia due to COVID-19  COVID-19 Labs  Recent Labs    06/13/19 1544 06/13/19 1547 06/15/19 0015 06/16/19 0140  DDIMER 0.67*  --   0.59* 0.42  FERRITIN  --  133  --   --   LDH 273*  --   --   --   CRP  --  6.1* 2.9* 1.1*    Lab Results  Component Value Date   SARSCOV2NAA POSITIVE (A) 06/13/2019     Fever: Remains afebrile Oxygen requirements: Room air.  Saturating in the 90s. Antibacterials: None Remdesivir: Day 4 Steroids: Dexamethasone 6 mg daily Diuretics: None Actemra: Not given yet Vitamin C and Zinc: Continue DVT Prophylaxis:  Lovenox 40 milligrams daily  Patient's respiratory status is stable.  He has improved, minimal on exertion, dry cough is improving as well, her inflammatory markers is improving which is reassuring, D-dimer has normalized, CRP almost to normal at 1.1 , was encouraged again to use incentive spirometry and flutter valve .   Continue with remdesivir and steroids.  Hold off on Actemra and convalescent plasma.  Continue with incentive spirometry mobilization and prone positioning.  Procalcitonin was less than 0.1.  Concern for right lung nodule Vague nodular opacity noted on chest x-ray.  Patient denies any history of smoking.  CT scan was ordered.  No PE noted.  No mention of any nodular lesions.  Bilateral patchy opacities noted.  Patient was reassured.  Will recommend chest x-ray in 4 to 6 weeks after pneumonia has cleared up.  UTI -Does report dysuria, has positive urine analysis, continue with Rocephin,  follow on urine cultures.  Essential hypertension Blood pressure is reasonably well controlled.  Continue with lisinopril.  Diabetes mellitus type 2, uncontrolled with hyperglycemia Patient on glimepiride Metformin and Soliqua at home.  HbA1c 8.4.  Continue SSI.  Continue Lantus.  Holding oral medications.    Hyperlipidemia Continue statin.  LFTs are normal.  Hypokalemia Potassium is normal today.  Normocytic anemia No evidence of overt bleeding.  Continue to monitor periodically.  DVT Prophylaxis: Lovenox PUD Prophylaxis: Continue Protonix Code Status: Full  code Family Communication: Discussed with patient and her husband Disposition Plan: Hopefully home when ready for discharge.  Continue to mobilize.   Medications:  Scheduled:  albuterol  2 puff Inhalation Q6H   aspirin EC  81 mg Oral Daily   atorvastatin  40 mg Oral q1800   benzonatate  100 mg Oral TID   cholecalciferol  1,000 Units Oral Daily   dexamethasone  6 mg Oral Q24H   enoxaparin (LOVENOX) injection  40 mg Subcutaneous Daily   insulin aspart  0-20 Units Subcutaneous TID WC   insulin aspart  0-5 Units Subcutaneous QHS   insulin aspart  4 Units Subcutaneous TID WC   insulin glargine  38 Units Subcutaneous Daily   pantoprazole  40 mg Oral Daily   sodium chloride flush  3 mL Intravenous Q12H   vitamin C  500 mg Oral Daily   zinc sulfate  220 mg Oral Daily   Continuous:  sodium chloride     cefTRIAXone (ROCEPHIN)  IV 200 mL/hr at 06/15/19 1817   remdesivir 100 mg in NS 250 mL 100 mg (06/16/19 0916)   [id number] chloride, guaiFENesin-dextromethorphan, lip balm, sodium chloride flush   Objective:  Vital Signs  Vitals:   06/15/19 1618 06/15/19 2000 06/16/19 0500 06/16/19 0729  BP: 112/69 111/71 122/79 122/64  Pulse: 82 76 81 75  Resp: 16 16 16 18   Temp: 98.4 F (36.9 C) 98.3 F (36.8 C) 98.2 F (36.8 C) 98.1 F (36.7 C)  TempSrc: Oral Oral Oral Oral  SpO2: 96% 96% 97% 95%  Weight:      Height:        Intake/Output Summary (Last 24 hours) at 06/16/2019 1041 Last data filed at 06/16/2019 0300 Gross per 24 hour  Intake 1070.93 ml  Output --  Net 1070.93 ml   Filed Weights   06/14/19 0022  Weight: 68.9 kg    Awake Alert, Oriented X 3, No new F.N deficits, Normal affect Symmetrical Chest wall movement, Good air movement bilaterally, she has some scattered rales RRR,No Gallops,Rubs or new Murmurs, No Parasternal Heave +ve B.Sounds, Abd Soft, No tenderness, No rebound - guarding or rigidity. No Cyanosis, Clubbing or edema, No new Rash or  bruise     Lab Results:  Data Reviewed: I have personally reviewed following labs and imaging studies  CBC: Recent Labs  Lab 06/13/19 1544 06/15/19 0015 06/16/19 0140  WBC 8.3 7.6 6.6  NEUTROABS 6.0 5.3 4.9  HGB 13.7 11.3* 11.2*  HCT 42.7 36.0 35.1*  MCV 89.1 90.7 89.1  PLT 474* 489* 518*    Basic Metabolic Panel: Recent Labs  Lab 06/13/19 1544 06/15/19 0015 06/16/19 0140  NA 139 140 138  K 3.1* 4.1 4.4  CL 103 104 105  CO2 24 25 23   GLUCOSE 218* 187* 294*  BUN 17 15 20   CREATININE 0.64 0.67 0.55  CALCIUM 9.3 9.0 9.0  MG  --  1.9  --     GFR: Estimated Creatinine Clearance: 73.1  mL/min (by C-G formula based on SCr of 0.55 mg/dL).  Liver Function Tests: Recent Labs  Lab 06/13/19 1544 06/15/19 0015 06/16/19 0140  AST 23 18 14*  ALT 23 16 17   ALKPHOS 80 67 66  BILITOT 0.5 0.6 0.5  PROT 7.6 6.2* 6.3*  ALBUMIN 3.6 3.0* 3.1*    CBG: Recent Labs  Lab 06/15/19 0730 06/15/19 1158 06/15/19 1625 06/15/19 2117 06/16/19 0814  GLUCAP 286* 159* 203* 151* 273*    Lipid Profile: Recent Labs    06/13/19 1546  TRIG 104    Anemia Panel: Recent Labs    06/13/19 1547  FERRITIN 133    Recent Results (from the past 240 hour(s))  Blood Culture (routine x 2)     Status: None (Preliminary result)   Collection Time: 06/13/19  2:57 PM   Specimen: BLOOD  Result Value Ref Range Status   Specimen Description   Final    BLOOD RIGHT ANTECUBITAL Performed at [facility], [place] [address], [place], [place] [address]    Special Requests   Final    BOTTLES DRAWN AEROBIC AND ANAEROBIC Blood Culture adequate volume Performed at [facility] [address] [place], [place] [address]    Culture   Final    NO GROWTH 2 DAYS Performed at [facility] [address] [place], [place] [address]    Report Status PENDING  Incomplete  Blood Culture (routine x 2)     Status: None (Preliminary result)   Collection Time:  06/13/19  3:49 PM   Specimen: BLOOD  Result Value Ref Range Status   Specimen Description   Final    BLOOD LEFT ANTECUBITAL Performed at [facility] [address] [place], [place] [address]    Special Requests   Final    BOTTLES DRAWN AEROBIC AND ANAEROBIC Blood Culture results may not be optimal due to an inadequate volume of blood received in culture bottles Performed at [facility] [address] [place], [place] [address]    Culture   Final    NO GROWTH 2 DAYS Performed at [facility] [address], [place], [place] [address]    Report Status PENDING  Incomplete  SARS Coronavirus 2 by RT PCR (hospital order, performed in [facility] hospital lab) Nasopharyngeal Nasopharyngeal Swab     Status: Abnormal   Collection Time: 06/13/19  6:00 PM   Specimen: Nasopharyngeal Swab  Result Value Ref Range Status   SARS Coronavirus 2 POSITIVE (A) NEGATIVE Final    Comment: RESULT CALLED TO, READ BACK BY AND VERIFIED WITH: A.HODGES,RN [id number] @2100  BY V.WILKINS (NOTE) If result is NEGATIVE SARS-CoV-2 target nucleic acids are NOT DETECTED. The SARS-CoV-2 RNA is generally detectable in upper and lower  respiratory specimens during the acute phase of infection. The lowest  concentration of SARS-CoV-2 viral copies this assay can detect is 250  copies / mL. A negative result does not preclude SARS-CoV-2 infection  and should not be used as the sole basis for treatment or other  patient management decisions.  A negative result may occur with  improper specimen collection / handling, submission of specimen other  than nasopharyngeal swab, presence of viral mutation(s) within the  areas targeted by this assay, and inadequate number of viral copies  (<250 copies / mL). A negative result must be combined with clinical  observations, patient history, and epidemiological information. If result is POSITIVE SARS-CoV-2 target nucleic acids are  DETECTED.  The SARS-CoV-2 RNA is generally detectable in upper and lower  respiratory specimens during the acute phase of infection.  Positive  results are indicative of active infection with SARS-CoV-2.  Clinical  correlation with patient history and other diagnostic information is  necessary to determine patient infection status.  Positive results do  not rule out bacterial infection or co-infection with other viruses. If result is PRESUMPTIVE POSTIVE SARS-CoV-2 nucleic acids MAY BE PRESENT.   A presumptive positive result was obtained on the submitted specimen  and confirmed on repeat testing.  While 2019 novel coronavirus  (SARS-CoV-2) nucleic acids may be present in the submitted sample  additional confirmatory testing may be necessary for epidemiological  and / or clinical management purposes  to differentiate between  SARS-CoV-2 and other Sarbecovirus currently known to infect humans.  If clinically indicated additional testing with an alternate test  methodology [phone])  is advised. The SARS-CoV-2 RNA is generally  detectable in upper and lower respiratory specimens during the acute  phase of infection. The expected result is Negative. Fact Sheet for Patients:  StrictlyIdeas.no Fact Sheet for Healthcare Providers: BankingDealers.co.za This test is not yet approved or cleared by the [place] FDA and has been authorized for detection and/or diagnosis of SARS-CoV-2 by FDA under an Emergency Use Authorization (EUA).  This EUA will remain in effect (meaning this test can be used) for the duration of the COVID-19 declaration under Section 564(b)(1) of the Act, 21 U.S.C. section 360bbb-3(b)(1), unless the authorization is terminated or revoked sooner. Performed at [facility], [place] [address], [place], [place] [address]       Radiology Studies: Ct Chest W Contrast  Result Date: 06/15/2019 CLINICAL DATA:   58 year old female with lung nodule. EXAM: CT CHEST WITH CONTRAST TECHNIQUE: Multidetector CT imaging of the chest was performed during intravenous contrast administration. CONTRAST:  27mL OMNIPAQUE IOHEXOL 350 MG/ML SOLN COMPARISON:  Chest radiograph dated 06/13/2019 FINDINGS: Cardiovascular: There is no cardiomegaly or pericardial effusion. The thoracic aorta is unremarkable. The origins of the great vessels of the aortic arch appear patent. No pulmonary artery embolus identified. Mediastinum/Nodes: No hilar or mediastinal adenopathy. The esophagus and the thyroid gland are grossly unremarkable. No mediastinal fluid collection. Lungs/Pleura: Bilateral peripheral and subpleural patchy and clusters of ground-glass densities most consistent with multifocal pneumonia, likely of atypical or viral etiology. There are a spectrum of findings in the lungs which can be seen with acute atypical infection (as well as other non-infectious etiologies). In particular, viral pneumonia (including COVID-19) should be considered in the appropriate clinical setting. There is no pleural effusion or pneumothorax. The central airways are patent. Upper Abdomen: Fatty liver. The visualized upper abdomen otherwise unremarkable. Musculoskeletal: No chest wall abnormality. No acute or significant osseous findings. IMPRESSION: 1. No CT evidence of pulmonary embolism. 2. Bilateral peripheral and subpleural patchy and clusters of ground-glass densities most consistent with multifocal pneumonia, likely of atypical or viral etiology. In particular, viral pneumonia (including COVID) should be considered in the appropriate clinical setting. Electronically Signed   By: Anner Crete M.D.   On: 06/15/2019 12:22       LOS: 3 days   Phillips Climes MD  [facility] Pager on [email or web address]  06/16/2019, 10:41 AM

## 2019-06-16 NOTE — Progress Notes (Signed)
Patient progressing towards goal

## 2019-06-16 NOTE — Progress Notes (Signed)
Results for Hannah Cantrell, Hannah Cantrell (MRN [id number]) as of 06/16/2019 11:52  Ref. Range 06/15/2019 11:58 06/15/2019 16:25 06/15/2019 21:17 06/16/2019 08:14 06/16/2019 11:19  Glucose-Capillary Latest Ref Range: 70 - 99 mg/dL 159 (H) 203 (H) 151 (H) 273 (H) 270 (H)  Noted that blood sugars have been greater than 200 mg/dl.  Recommend increasing Lantus to 42 units daily, increase Novolog to 6 units TID with meals if patient eats at least 50% of meals.   Harvel Ricks RN BSN CDE Diabetes Coordinator Pager: [phone]  8am-5pm

## 2019-06-17 LAB — CBC WITH DIFFERENTIAL/PLATELET
Abs Immature Granulocytes: 0.06 10*3/uL (ref 0.00–0.07)
Basophils Absolute: 0 10*3/uL (ref 0.0–0.1)
Basophils Relative: 0 %
Eosinophils Absolute: 0 10*3/uL (ref 0.0–0.5)
Eosinophils Relative: 0 %
HCT: 35.6 % — ABNORMAL LOW (ref 36.0–46.0)
Hemoglobin: 11.5 g/dL — ABNORMAL LOW (ref 12.0–15.0)
Immature Granulocytes: 1 %
Lymphocytes Relative: 24 %
Lymphs Abs: 2 10*3/uL (ref 0.7–4.0)
MCH: 29 pg (ref 26.0–34.0)
MCHC: 32.3 g/dL (ref 30.0–36.0)
MCV: 89.7 fL (ref 80.0–100.0)
Monocytes Absolute: 0.7 10*3/uL (ref 0.1–1.0)
Monocytes Relative: 9 %
Neutro Abs: 5.6 10*3/uL (ref 1.7–7.7)
Neutrophils Relative %: 66 %
Platelets: 547 10*3/uL — ABNORMAL HIGH (ref 150–400)
RBC: 3.97 MIL/uL (ref 3.87–5.11)
RDW: 12 % (ref 11.5–15.5)
WBC: 8.4 10*3/uL (ref 4.0–10.5)
nRBC: 0 % (ref 0.0–0.2)

## 2019-06-17 LAB — COMPREHENSIVE METABOLIC PANEL
ALT: 20 U/L (ref 0–44)
AST: 19 U/L (ref 15–41)
Albumin: 3 g/dL — ABNORMAL LOW (ref 3.5–5.0)
Alkaline Phosphatase: 65 U/L (ref 38–126)
Anion gap: 10 (ref 5–15)
BUN: 18 mg/dL (ref 6–20)
CO2: 25 mmol/L (ref 22–32)
Calcium: 9.1 mg/dL (ref 8.9–10.3)
Chloride: 106 mmol/L (ref 98–111)
Creatinine, Ser: 0.73 mg/dL (ref 0.44–1.00)
GFR calc Af Amer: >60 mL/min (ref >60)
GFR calc non Af Amer: >60 mL/min (ref >60)
Glucose, Bld: 113 mg/dL — ABNORMAL HIGH (ref 70–99)
Potassium: 4.2 mmol/L (ref 3.5–5.1)
Sodium: 141 mmol/L (ref 135–145)
Total Bilirubin: 0.1 mg/dL — ABNORMAL LOW (ref 0.3–1.2)
Total Protein: 6.2 g/dL — ABNORMAL LOW (ref 6.5–8.1)

## 2019-06-17 LAB — D-DIMER, QUANTITATIVE: D-Dimer, Quant: 0.4 ug/mL-FEU (ref 0.00–0.50)

## 2019-06-17 LAB — GLUCOSE, CAPILLARY
Glucose-Capillary: 228 mg/dL — ABNORMAL HIGH (ref 70–99)
Glucose-Capillary: 328 mg/dL — ABNORMAL HIGH (ref 70–99)

## 2019-06-17 LAB — C-REACTIVE PROTEIN: CRP: <0.8 mg/dL (ref <1.0)

## 2019-06-17 MED ORDER — DEXAMETHASONE 6 MG PO TABS
6.0000 mg | ORAL_TABLET | ORAL | Status: DC
  Administered 2019-06-17: 6 mg via ORAL
  Filled 2019-06-17: qty 1

## 2019-06-17 MED ORDER — DEXAMETHASONE 6 MG PO TABS: 6.0000 mg | ORAL_TABLET | Freq: Every day | ORAL | 0 refills | Status: AC

## 2019-06-17 MED ORDER — SODIUM CHLORIDE 0.9 % IV SOLN
1.0000 g | INTRAVENOUS | Status: DC
  Administered 2019-06-17: 1 g via INTRAVENOUS
  Filled 2019-06-17: qty 10

## 2019-06-17 NOTE — Discharge Summary (Signed)
Hannah Cantrell, is a 58 y.o. female  DOB [birth year]  MRN [id number].  Admission date:  06/13/2019  Admitting Physician  Cristal Deer, MD  Discharge Date:  06/17/2019   Primary MD  Chesley Noon, MD  Recommendations for primary care physician for things to follow:  -Check CBC, BMP during next visit -View chest x-ray in 6 to 8 weeks to ensure resolution of right lung nodule after her pneumonia has cleared up   Admission Diagnosis  COVID-19 [U07.1]   Discharge Diagnosis  COVID-19 [U07.1]   Principal Problem:   Pneumonia due to COVID-19 virus Active Problems:   Type 1 diabetes mellitus without complication ([place])   Acute respiratory failure with hypoxemia ([place])   Essential hypertension   Asthma in adult without complication      Past Medical History:  Diagnosis Date   Diabetes mellitus without complication ([place])    Hyperlipidemia    Hypertension    Osteopenia 11/2017   T score -1.5 FRAX 7.3% / 0.6%    Past Surgical History:  Procedure Laterality Date   BREAST CYST ASPIRATION     1983       History of present illness and  Hospital Course:     Kindly see H&P for history of present illness and admission details, please review complete Labs, Consult reports and Test reports for all details in brief  HPI  from the history and physical done on the day of admission 06/13/2019   HPI: Thersea Chrismon Cantrell is a 58 y.o. female with medical history significant for diabetes mellitus type 2, hypertension, hyperlipidemia, osteopenia asthma, who presented to the emergency department with shortness of breath that began yesterday and is worsening since to the extent that she is short of breath at rest patient stated that she worsened with exacerbation but endorses shortness of breath at rest periodically she is also on inhalers.  She denies chest pain headaches she does have fever  with a T-max of 102 and a some cough she stated the fever improved with Tylenol and ibuprofen she also has body aches and fatigue loss of smell and taste was also positive.  She stated she was tested for Covid on November 6 which was positive..  She stated her husband has Covid and is admitted to [facility] as well.  She has also had some decreased appetite over the past 3 days and has not been motivated to drink or eat but she denies nausea vomiting or abdominal pain.  In the ED she was noted to desaturate to 90 to 93% when she ambulated but at rest she was up to 95 up to 100.  She was also noted to be tachycardic in the 100s and accelerates up to 125 when she stands up or moves around she was not in respiratory distress.  Her platelet was noted to be 474 potassium 3.1 glucose 218 D-dimer 0.67 LDL 236 273 fibrinogen 755 CRP 6.1 blood culture x2 pending chest x-ray showed bilateral airspace opacity consistent with Covid pneumonia  Hospital Course   58 y.o.femalewith medical history significant fordiabetes mellitus type 2, hypertension, hyperlipidemia, osteopenia asthma, who presented to the emergency department with shortness of breath.  She was diagnosed with COVID-19 on November 6.  Her husband was also positive and was hospitalized at [facility].  Patient was found to have bilateral airspace disease on chest x-ray.  She was hospitalized for further management.   Acute Hypoxic Resp. Failure/Pneumonia due to COVID-19  COVID-19 Labs  Recent Labs (last 2 labs)         Recent Labs    06/13/19 1544 06/13/19 1547 06/15/19 0015 06/16/19 0140  DDIMER 0.67*  --  0.59* 0.42  FERRITIN  --  133  --   --   LDH 273*  --   --   --   CRP  --  6.1* 2.9* 1.1*      Recent Labs       Lab Results  Component Value Date   SARSCOV2NAA POSITIVE (A) 06/13/2019       Fever: Remains afebrile Oxygen requirements: Room air.  Saturating in the 90s. Antibacterials:  None Remdesivir: Day 4 Steroids: Dexamethasone 6 mg daily Diuretics: None Actemra: Not given yet Vitamin C and Zinc: Continue DVT Prophylaxis:  Lovenox 40 milligrams daily  Patient's respiratory status is stable.   Hypoxia has resolved, no oxygen requirement for last couple days, tolerating activity with no oxygen requirement, dyspnea is minimal on exertion, she was treated with IV remdesivir x5 days, she was treated with dexamethasone during hospital stay, she will be discharged on another 5 days for total of 5 days treatment.  Was encouraged to take her incentive spirometry and continue using it at home.  Concern for right lung nodule Vague nodular opacity noted on chest x-ray.  Patient denies any history of smoking.  CT scan was ordered.  No PE noted.  No mention of any nodular lesions.  Bilateral patchy opacities noted.  Patient was reassured.  Will recommend chest x-ray in 4 to 6 weeks after pneumonia has cleared up.  UTI -Does report dysuria, has positive urine analysis, received Rocephin x3 days  Essential hypertension Blood pressure is reasonably well controlled.  Continue with lisinopril.  Diabetes mellitus type 2, uncontrolled with hyperglycemia Patient on glimepiride Metformin and Soliqua at home.  HbA1c 8.4.  Resume home regimen on discharge  Hyperlipidemia Continue statin.  LFTs are normal.  Hypokalemia Potassium is normal today.  Normocytic anemia No evidence of overt bleeding.  Continue to monitor periodically.     Discharge Condition:  Stable   Follow UP  Follow-up Information    Chesley Noon, MD Follow up in 2 week(s).   Specialty: Family Medicine Why: Inform them of your COVID-19 positive status Contact information: [place] [id number] [phone]             Discharge Instructions  and  Discharge Medications    Discharge Instructions    Discharge instructions   Complete by: As directed    Follow with  Primary MD Chesley Noon, MD in 14 days   Get CBC, CMP, 2 view Chest X ray checked  by Primary MD next visit.    Activity: As tolerated with Full fall precautions use walker/cane & assistance as needed   Disposition Home    Diet: Heart Healthy /Carb modified , with feeding assistance and aspiration precautions.   On your next visit with your primary care physician please Get Medicines reviewed and adjusted.  Please request your Prim.MD to go over all Hospital Tests and Procedure/Radiological results at the follow up, please get all Hospital records sent to your Prim MD by signing hospital release before you go home.   If you experience worsening of your admission symptoms, develop shortness of breath, life threatening emergency, suicidal or homicidal thoughts you must seek medical attention immediately by calling 911 or calling your MD immediately  if symptoms less severe.  You Must read complete instructions/literature along with all the possible adverse reactions/side effects for all the Medicines you take and that have been prescribed to you. Take any new Medicines after you have completely understood and accpet all the possible adverse reactions/side effects.   Do not drive, operating heavy machinery, perform activities at heights, swimming or participation in water activities or provide baby sitting services if your were admitted for syncope or siezures until you have seen by Primary MD or a Neurologist and advised to do so again.  Do not drive when taking Pain medications.    Do not take more than prescribed Pain, Sleep and Anxiety Medications  Special Instructions: If you have smoked or chewed Tobacco  in the last 2 yrs please stop smoking, stop any regular Alcohol  and or any Recreational drug use.  Wear Seat belts while driving.   Please note  You were cared for by a hospitalist during your hospital stay. If you have any questions about your discharge medications  or the care you received while you were in the hospital after you are discharged, you can call the unit and asked to speak with the hospitalist on call if the hospitalist that took care of you is not available. Once you are discharged, your primary care physician will handle any further medical issues. Please note that NO REFILLS for any discharge medications will be authorized once you are discharged, as it is imperative that you return to your primary care physician (or establish a relationship with a primary care physician if you do not have one) for your aftercare needs so that they can reassess your need for medications and monitor your lab values.   Increase activity slowly   Complete by: As directed      Allergies as of 06/17/2019   No Known Allergies     Medication List    TAKE these medications   albuterol 108 (90 Base) MCG/ACT inhaler Commonly known as: VENTOLIN HFA Inhale into the lungs.   atorvastatin 40 MG tablet Commonly known as: LIPITOR Take 40 mg by mouth daily.   benzonatate 100 MG capsule Commonly known as: TESSALON Take 100 mg by mouth 3 (three) times daily.   dexamethasone 6 MG tablet Commonly known as: DECADRON Take 1 tablet (6 mg total) by mouth daily for 5 days. Start taking on: June 18, 2019   glimepiride 4 MG tablet Commonly known as: AMARYL Take 4 mg by mouth 2 (two) times daily.   lisinopril 5 MG tablet Commonly known as: ZESTRIL Take 5 mg by mouth daily.   metFORMIN 1000 MG tablet Commonly known as: GLUCOPHAGE Take 1,000 mg by mouth 2 (two) times daily with a meal.   pantoprazole 40 MG tablet Commonly known as: PROTONIX Take 40 mg by mouth daily as needed.   Soliqua 100-33 UNT-MCG/ML Sopn Generic drug: Insulin Glargine-Lixisenatide Inject 5-38 Units into the skin 2 (two) times daily as needed. 38 units every morning and prn 5 units in evening.         Diet and Activity  recommendation: See Discharge Instructions above   Consults  obtained -  None   Major procedures and Radiology Reports - PLEASE review detailed and final reports for all details, in brief -      Ct Chest W Contrast  Result Date: 06/15/2019 CLINICAL DATA:  58 year old female with lung nodule. EXAM: CT CHEST WITH CONTRAST TECHNIQUE: Multidetector CT imaging of the chest was performed during intravenous contrast administration. CONTRAST:  63mL OMNIPAQUE IOHEXOL 350 MG/ML SOLN COMPARISON:  Chest radiograph dated 06/13/2019 FINDINGS: Cardiovascular: There is no cardiomegaly or pericardial effusion. The thoracic aorta is unremarkable. The origins of the great vessels of the aortic arch appear patent. No pulmonary artery embolus identified. Mediastinum/Nodes: No hilar or mediastinal adenopathy. The esophagus and the thyroid gland are grossly unremarkable. No mediastinal fluid collection. Lungs/Pleura: Bilateral peripheral and subpleural patchy and clusters of ground-glass densities most consistent with multifocal pneumonia, likely of atypical or viral etiology. There are a spectrum of findings in the lungs which can be seen with acute atypical infection (as well as other non-infectious etiologies). In particular, viral pneumonia (including COVID-19) should be considered in the appropriate clinical setting. There is no pleural effusion or pneumothorax. The central airways are patent. Upper Abdomen: Fatty liver. The visualized upper abdomen otherwise unremarkable. Musculoskeletal: No chest wall abnormality. No acute or significant osseous findings. IMPRESSION: 1. No CT evidence of pulmonary embolism. 2. Bilateral peripheral and subpleural patchy and clusters of ground-glass densities most consistent with multifocal pneumonia, likely of atypical or viral etiology. In particular, viral pneumonia (including COVID) should be considered in the appropriate clinical setting. Electronically Signed   By: Anner Crete M.D.   On: 06/15/2019 12:22   Dg Chest Port 1  View  Result Date: 06/13/2019 CLINICAL DATA:  Shortness of breath EXAM: PORTABLE CHEST 1 VIEW COMPARISON:  None. FINDINGS: The heart size and mediastinal contours are within normal limits. There are subtle heterogeneous airspace opacities bilaterally. Somewhat more nodular opacity of the right upper lobe measuring approximately 8 mm in projection. The visualized skeletal structures are unremarkable. IMPRESSION: 1. There are subtle heterogeneous airspace opacities bilaterally, concerning for infection. 2. Somewhat more nodular opacity of the right upper lobe measuring approximately 8 mm in projection, nonspecific. Consider CT to further evaluate. Electronically Signed   By: Eddie Candle M.D.   On: 06/13/2019 15:31    Micro Results     Recent Results (from the past 240 hour(s))  Blood Culture (routine x 2)     Status: None (Preliminary result)   Collection Time: 06/13/19  2:57 PM   Specimen: BLOOD  Result Value Ref Range Status   Specimen Description   Final    BLOOD RIGHT ANTECUBITAL Performed at [facility] [address] [place], [place] [address]    Special Requests   Final    BOTTLES DRAWN AEROBIC AND ANAEROBIC Blood Culture adequate volume Performed at [facility] [address], [place], [place] [address]    Culture   Final    NO GROWTH 4 DAYS Performed at [facility], [place] [address] [place], [place] [address]    Report Status PENDING  Incomplete  Blood Culture (routine x 2)     Status: None (Preliminary result)   Collection Time: 06/13/19  3:49 PM   Specimen: BLOOD  Result Value Ref Range Status   Specimen Description   Final    BLOOD LEFT ANTECUBITAL Performed at [facility] [address] [place],  [address]  Special Requests   Final    BOTTLES DRAWN AEROBIC AND ANAEROBIC Blood Culture results may not be optimal due to an inadequate volume of blood received in culture bottles Performed at  [facility] [address], [place], [place] [address]    Culture   Final    NO GROWTH 4 DAYS Performed at [facility], [place] [address], [place], [place] [address]    Report Status PENDING  Incomplete  SARS Coronavirus 2 by RT PCR (hospital order, performed in [facility] hospital lab) Nasopharyngeal Nasopharyngeal Swab     Status: Abnormal   Collection Time: 06/13/19  6:00 PM   Specimen: Nasopharyngeal Swab  Result Value Ref Range Status   SARS Coronavirus 2 POSITIVE (A) NEGATIVE Final    Comment: RESULT CALLED TO, READ BACK BY AND VERIFIED WITH: A.HODGES,RN [id number] @2100  BY V.WILKINS (NOTE) If result is NEGATIVE SARS-CoV-2 target nucleic acids are NOT DETECTED. The SARS-CoV-2 RNA is generally detectable in upper and lower  respiratory specimens during the acute phase of infection. The lowest  concentration of SARS-CoV-2 viral copies this assay can detect is 250  copies / mL. A negative result does not preclude SARS-CoV-2 infection  and should not be used as the sole basis for treatment or other  patient management decisions.  A negative result may occur with  improper specimen collection / handling, submission of specimen other  than nasopharyngeal swab, presence of viral mutation(s) within the  areas targeted by this assay, and inadequate number of viral copies  (<250 copies / mL). A negative result must be combined with clinical  observations, patient history, and epidemiological information. If result is POSITIVE SARS-CoV-2 target nucleic acids are DETECTED.  The SARS-CoV-2 RNA is generally detectable in upper and lower  respiratory specimens during the acute phase of infection.  Positive  results are indicative of active infection with SARS-CoV-2.  Clinical  correlation with patient history and other diagnostic information is  necessary to determine patient infection status.  Positive results do  not rule out bacterial infection or co-infection with  other viruses. If result is PRESUMPTIVE POSTIVE SARS-CoV-2 nucleic acids MAY BE PRESENT.   A presumptive positive result was obtained on the submitted specimen  and confirmed on repeat testing.  While 2019 novel coronavirus  (SARS-CoV-2) nucleic acids may be present in the submitted sample  additional confirmatory testing may be necessary for epidemiological  and / or clinical management purposes  to differentiate between  SARS-CoV-2 and other Sarbecovirus currently known to infect humans.  If clinically indicated additional testing with an alternate test  methodology [phone])  is advised. The SARS-CoV-2 RNA is generally  detectable in upper and lower respiratory specimens during the acute  phase of infection. The expected result is Negative. Fact Sheet for Patients:  StrictlyIdeas.no Fact Sheet for Healthcare Providers: BankingDealers.co.za This test is not yet approved or cleared by the [place] FDA and has been authorized for detection and/or diagnosis of SARS-CoV-2 by FDA under an Emergency Use Authorization (EUA).  This EUA will remain in effect (meaning this test can be used) for the duration of the COVID-19 declaration under Section 564(b)(1) of the Act, 21 U.S.C. section 360bbb-3(b)(1), unless the authorization is terminated or revoked sooner. Performed at [facility], [place] [address] [place], [place] [address]   Culture, Urine     Status: Abnormal   Collection Time: 06/15/19  5:02 PM   Specimen: Urine, Clean Catch  Result Value Ref Range Status  Specimen Description   Final    URINE, CLEAN CATCH Performed at [facility], [place] [address], [place], [place] [address]    Special Requests   Final    NONE Performed at [facility], [place] [address] [place], [place] [address]    Culture (A)  Final    <10,000 COLONIES/mL INSIGNIFICANT GROWTH Performed at [facility] [address] [place],  [address]    Report Status 06/16/2019 FINAL  Final       Today   Subjective:   Vannesa Fogelberg today has no headache,no chest or abdominal pain,still with some dry cough.  Objective:   Blood pressure 119/79, pulse 60, temperature 98.1 F (36.7 C), temperature source Oral, resp. rate 18, height 5\' 4"  (1.626 m), weight 68.9 kg, SpO2 95 %.   Intake/Output Summary (Last 24 hours) at 06/17/2019 1204 Last data filed at 06/17/2019 1000 Gross per 24 hour  Intake 830.71 ml  Output --  Net 830.71 ml    Exam Awake Alert, Oriented x 3, No new F.N deficits, Normal affect Symmetrical Chest wall movement, Good air movement bilaterally, CTAB RRR,No Gallops,Rubs or new Murmurs, No Parasternal Heave +ve B.Sounds, Abd Soft, Non tender, No rebound -guarding or rigidity. No Cyanosis, Clubbing or edema, No new Rash or bruise  Data Review   CBC w Diff:  Lab Results  Component Value Date   WBC 8.4 06/17/2019   HGB 11.5 (L) 06/17/2019   HCT 35.6 (L) 06/17/2019   PLT 547 (H) 06/17/2019   LYMPHOPCT 24 06/17/2019   MONOPCT 9 06/17/2019   EOSPCT 0 06/17/2019   BASOPCT 0 06/17/2019    CMP:  Lab Results  Component Value Date   NA 141 06/17/2019   K 4.2 06/17/2019   CL 106 06/17/2019   CO2 25 06/17/2019   BUN 18 06/17/2019   CREATININE 0.73 06/17/2019   PROT 6.2 (L) 06/17/2019   ALBUMIN 3.0 (L) 06/17/2019   BILITOT 0.1 (L) 06/17/2019   ALKPHOS 65 06/17/2019   AST 19 06/17/2019   ALT 20 06/17/2019  .   Total Time in preparing paper work, data evaluation and todays exam - 58 minutes  Phillips Climes M.D on 06/17/2019 at 12:04 PM  [facility]   Office  [phone]

## 2019-06-17 NOTE — Progress Notes (Addendum)
MD Elgergawy stated okay to D/C pt after Rocephin and dexamethasone this afternoon, no need to recheck BG before D/C via secure chat. 06/17/2019 [place], RN

## 2019-06-17 NOTE — Discharge Instructions (Signed)
Person Under Monitoring Name: Hannah Cantrell  Location: [place] [id number]   Infection Prevention Recommendations for Individuals Confirmed to have, or Being Evaluated for, 2019 Novel Coronavirus (COVID-19) Infection Who Receive Care at Home  Individuals who are confirmed to have, or are being evaluated for, COVID-19 should follow the prevention steps below until a healthcare provider or local or state health department says they can return to normal activities.  Stay home except to get medical care You should restrict activities outside your home, except for getting medical care. Do not go to work, school, or public areas, and do not use public transportation or taxis.  Call ahead before visiting your doctor Before your medical appointment, call the healthcare provider and tell them that you have, or are being evaluated for, COVID-19 infection. This will help the healthcare providers office take steps to keep other people from getting infected. Ask your healthcare provider to call the local or state health department.  Monitor your symptoms Seek prompt medical attention if your illness is worsening (e.g., difficulty breathing). Before going to your medical appointment, call the healthcare provider and tell them that you have, or are being evaluated for, COVID-19 infection. Ask your healthcare provider to call the local or state health department.  Wear a facemask You should wear a facemask that covers your nose and mouth when you are in the same room with other people and when you visit a healthcare provider. People who live with or visit you should also wear a facemask while they are in the same room with you.  Separate yourself from other people in your home As much as possible, you should stay in a different room from other people in your home. Also, you should use a separate bathroom, if available.  Avoid sharing household items You  should not share dishes, drinking glasses, cups, eating utensils, towels, bedding, or other items with other people in your home. After using these items, you should wash them thoroughly with soap and water.  Cover your coughs and sneezes Cover your mouth and nose with a tissue when you cough or sneeze, or you can cough or sneeze into your sleeve. Throw used tissues in a lined trash can, and immediately wash your hands with soap and water for at least 20 seconds or use an alcohol-based hand rub.  Wash your [facility] your hands often and thoroughly with soap and water for at least 20 seconds. You can use an alcohol-based hand sanitizer if soap and water are not available and if your hands are not visibly dirty. Avoid touching your eyes, nose, and mouth with unwashed hands.   Prevention Steps for Caregivers and Household Members of Individuals Confirmed to have, or Being Evaluated for, COVID-19 Infection Being Cared for in the Home  If you live with, or provide care at home for, a person confirmed to have, or being evaluated for, COVID-19 infection please follow these guidelines to prevent infection:  Follow healthcare providers instructions Make sure that you understand and can help the patient follow any healthcare provider instructions for all care.  Provide for the patients basic needs You should help the patient with basic needs in the home and provide support for getting groceries, prescriptions, and other personal needs.  Monitor the patients symptoms If they are getting sicker, call his or her medical provider and tell them that the patient has, or is being evaluated for, COVID-19 infection. This will help the healthcare  providers office take steps to keep other people from getting infected. Ask the healthcare provider to call the local or state health department.  Limit the number of people who have contact with the patient  If possible, have only one caregiver for the  patient.  Other household members should stay in another home or place of residence. If this is not possible, they should stay  in another room, or be separated from the patient as much as possible. Use a separate bathroom, if available.  Restrict visitors who do not have an essential need to be in the home.  Keep older adults, very young children, and other sick people away from the patient Keep older adults, very young children, and those who have compromised immune systems or chronic health conditions away from the patient. This includes people with chronic heart, lung, or kidney conditions, diabetes, and cancer.  Ensure good ventilation Make sure that shared spaces in the home have good air flow, such as from an air conditioner or an opened window, weather permitting.  Wash your hands often  Wash your hands often and thoroughly with soap and water for at least 20 seconds. You can use an alcohol based hand sanitizer if soap and water are not available and if your hands are not visibly dirty.  Avoid touching your eyes, nose, and mouth with unwashed hands.  Use disposable paper towels to dry your hands. If not available, use dedicated cloth towels and replace them when they become wet.  Wear a facemask and gloves  Wear a disposable facemask at all times in the room and gloves when you touch or have contact with the patients blood, body fluids, and/or secretions or excretions, such as sweat, saliva, sputum, nasal mucus, vomit, urine, or feces.  Ensure the mask fits over your nose and mouth tightly, and do not touch it during use.  Throw out disposable facemasks and gloves after using them. Do not reuse.  Wash your hands immediately after removing your facemask and gloves.  If your personal clothing becomes contaminated, carefully remove clothing and launder. Wash your hands after handling contaminated clothing.  Place all used disposable facemasks, gloves, and other waste in a lined  container before disposing them with other household waste.  Remove gloves and wash your hands immediately after handling these items.  Do not share dishes, glasses, or other household items with the patient  Avoid sharing household items. You should not share dishes, drinking glasses, cups, eating utensils, towels, bedding, or other items with a patient who is confirmed to have, or being evaluated for, COVID-19 infection.  After the person uses these items, you should wash them thoroughly with soap and water.  Wash laundry thoroughly  Immediately remove and wash clothes or bedding that have blood, body fluids, and/or secretions or excretions, such as sweat, saliva, sputum, nasal mucus, vomit, urine, or feces, on them.  Wear gloves when handling laundry from the patient.  Read and follow directions on labels of laundry or clothing items and detergent. In general, wash and dry with the warmest temperatures recommended on the label.  Clean all areas the individual has used often  Clean all touchable surfaces, such as counters, tabletops, doorknobs, bathroom fixtures, toilets, phones, keyboards, tablets, and bedside tables, every day. Also, clean any surfaces that may have blood, body fluids, and/or secretions or excretions on them.  Wear gloves when cleaning surfaces the patient has come in contact with.  Use a diluted bleach solution (e.g., dilute bleach with  1 part bleach and 10 parts water) or a household disinfectant with a label that says EPA-registered for coronaviruses. To make a bleach solution at home, add 1 tablespoon of bleach to 1 quart (4 cups) of water. For a larger supply, add  cup of bleach to 1 gallon (16 cups) of water.  Read labels of cleaning products and follow recommendations provided on product labels. Labels contain instructions for safe and effective use of the cleaning product including precautions you should take when applying the product, such as wearing gloves or  eye protection and making sure you have good ventilation during use of the product.  Remove gloves and wash hands immediately after cleaning.  Monitor yourself for signs and symptoms of illness Caregivers and household members are considered close contacts, should monitor their health, and will be asked to limit movement outside of the home to the extent possible. Follow the monitoring steps for close contacts listed on the symptom monitoring form.   ? If you have additional questions, contact your local health department or call the epidemiologist on call at [phone] (available 24/7). ? This guidance is subject to change. For the most up-to-date guidance from [facility], please refer to their website: [email or web address]

## 2019-06-17 NOTE — Plan of Care (Signed)
Updated pt on POC, pt verbalized understanding 

## 2019-06-17 NOTE — Progress Notes (Signed)
Inpatient Diabetes Program Recommendations  AACE/ADA: New Consensus Statement on Inpatient Glycemic Control (2015)  Target Ranges:  Prepandial:   less than 140 mg/dL      Peak postprandial:   less than 180 mg/dL (1-2 hours)      Critically ill patients:  140 - 180 mg/dL   Lab Results  Component Value Date   GLUCAP 228 (H) 06/17/2019   HGBA1C 8.4 (H) 06/15/2019    Review of Glycemic Control Results for Hannah Cantrell, Hannah Cantrell (MRN [id number]) as of 06/17/2019 09:16  Ref. Range 06/16/2019 08:14 06/16/2019 11:19 06/16/2019 16:23 06/16/2019 20:07 06/17/2019 08:00  Glucose-Capillary Latest Ref Range: 70 - 99 mg/dL 273 (H) 270 (H) 177 (H) 178 (H) 228 (H)   Diabetes history: DM 2 Outpatient Diabetes medications: Glimepiride 4 mg bid, Soliqua 38 units Qam 5 units qpm (equivalent to 43 units of basal insulin), Metformin 1000 mg bid  Current orders for Inpatient glycemic control:  Lantus 38 units Novolog 0-20 units tid + hs Novolog 4 units tid meal coverage  Decadron 6 mg Q24 hours BUN/Creat WNL No PO Supplementation A1c 8.4 on 11/17  Inpatient Diabetes Program Recommendations:    Agree with DM coordinator recommendations requested yesterday  -  Increase Lantus to 42 units -  Increase Novolog meal coverage to 6 units tid if eating >50% of meals.  Thanks,  Tama Headings RN, MSN, BC-ADM Inpatient Diabetes Coordinator Team Pager [phone] (8a-5p)

## 2019-06-18 LAB — CULTURE, BLOOD (ROUTINE X 2)
Culture: NO GROWTH
Culture: NO GROWTH
Special Requests: ADEQUATE

## 2019-08-02 ENCOUNTER — Ambulatory Visit
Admission: RE | Admit: 2019-08-02 | Discharge: 2019-08-02 | Disposition: A | Discharge status: Home / Self Care | Payer: BC Managed Care – PPO | Source: Ambulatory Visit | Attending: Nurse Practitioner | Admitting: Nurse Practitioner

## 2019-08-02 ENCOUNTER — Other Ambulatory Visit: Payer: Self-pay | Admitting: Nurse Practitioner

## 2019-08-02 DIAGNOSIS — R9389 Abnormal findings on diagnostic imaging of other specified body structures: Secondary | ICD-10-CM

## 2019-08-17 ENCOUNTER — Other Ambulatory Visit: Payer: Self-pay | Admitting: Nurse Practitioner

## 2019-08-17 ENCOUNTER — Ambulatory Visit
Admission: RE | Admit: 2019-08-17 | Discharge: 2019-08-17 | Disposition: A | Discharge status: Home / Self Care | Payer: BC Managed Care – PPO | Source: Ambulatory Visit | Attending: Nurse Practitioner | Admitting: Nurse Practitioner

## 2019-08-17 DIAGNOSIS — J690 Pneumonitis due to inhalation of food and vomit: Secondary | ICD-10-CM

## 2019-08-17 DIAGNOSIS — R9389 Abnormal findings on diagnostic imaging of other specified body structures: Secondary | ICD-10-CM

## 2019-08-19 ENCOUNTER — Other Ambulatory Visit: Payer: Self-pay

## 2019-08-19 ENCOUNTER — Other Ambulatory Visit: Payer: Self-pay | Admitting: Obstetrics & Gynecology

## 2019-08-19 ENCOUNTER — Ambulatory Visit
Admission: RE | Admit: 2019-08-19 | Discharge: 2019-08-19 | Disposition: A | Discharge status: Home / Self Care | Payer: BC Managed Care – PPO | Source: Ambulatory Visit | Attending: Obstetrics & Gynecology | Admitting: Obstetrics & Gynecology

## 2019-08-19 DIAGNOSIS — R921 Mammographic calcification found on diagnostic imaging of breast: Secondary | ICD-10-CM

## 2019-08-19 DIAGNOSIS — R928 Other abnormal and inconclusive findings on diagnostic imaging of breast: Secondary | ICD-10-CM

## 2019-08-27 ENCOUNTER — Ambulatory Visit
Admission: RE | Admit: 2019-08-27 | Discharge: 2019-08-27 | Disposition: A | Discharge status: Home / Self Care | Payer: BC Managed Care – PPO | Source: Ambulatory Visit | Attending: Obstetrics & Gynecology | Admitting: Obstetrics & Gynecology

## 2019-08-27 ENCOUNTER — Other Ambulatory Visit: Payer: Self-pay

## 2019-08-27 DIAGNOSIS — R921 Mammographic calcification found on diagnostic imaging of breast: Secondary | ICD-10-CM

## 2019-08-27 HISTORY — PX: BREAST BIOPSY: SHX20

## 2019-08-30 ENCOUNTER — Telehealth: Payer: Self-pay | Admitting: General Surgery

## 2019-08-30 ENCOUNTER — Other Ambulatory Visit: Payer: Self-pay

## 2019-08-30 ENCOUNTER — Ambulatory Visit: Payer: BC Managed Care – PPO | Admitting: Emergency Medicine

## 2019-08-30 ENCOUNTER — Encounter: Payer: Self-pay | Admitting: Emergency Medicine

## 2019-08-30 VITALS — BP 102/74 | HR 110 | Ht 64.0 in | Wt 157.0 lb

## 2019-08-30 DIAGNOSIS — E1165 Type 2 diabetes mellitus with hyperglycemia: Secondary | ICD-10-CM | POA: Insufficient documentation

## 2019-08-30 DIAGNOSIS — J9601 Acute respiratory failure with hypoxia: Secondary | ICD-10-CM | POA: Diagnosis not present

## 2019-08-30 DIAGNOSIS — U071 COVID-19: Secondary | ICD-10-CM

## 2019-08-30 DIAGNOSIS — J1282 Pneumonia due to coronavirus disease 2019: Secondary | ICD-10-CM | POA: Diagnosis not present

## 2019-08-30 NOTE — Assessment & Plan Note (Signed)
She is clinically improved, almost back to baseline except for some residual fatigue.  Her chest x-ray had still not normalized by 08/17/2019.  She may end up with some persistent scar, or they may just be a prolonged period of resolution.  She needs a repeat CT scan of the chest this month to compare with November.  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  We will need to do close surveillance to ensure that she does not begin to develop ILD in the aftermath of her illness.  If so I will refer her to our ILD clinic for follow-up and possible antifibrotic's.  We will perform pulmonary function testing to establish your baseline lung function We will repeat your CT scan of the chest in February to compare with your films from November. Follow-up with Dr. Lamonte Sakai after your testing to review the results together

## 2019-08-30 NOTE — Progress Notes (Signed)
Subjective:    Patient ID: Hannah Cantrell, female    DOB: [birth year], 59 y.o.   MRN: [id number]  HPI Pleasant 59 year old woman, never smoker, with a history of diabetes, hyperlipidemia, hypertension on lisinopril.  She was diagnosed with COVID-19 pneumonitis in November 2020.  She was hospitalized 11/15-11/19, received remdesivir, dexamethasone, appropriate anticoagulation.  She was discharged home on room air. She has continued to have some neck pain / muscle tightness, some peripheral neuropathy. her exertional tolerance and her breathing have almost gone back to normal. She has some rare cough that she ascribes to rhinitis - about normal for her. Maybe a bit more fatigued. She checks her SpO2 and has not desaturated.   CT chest 06/15/2019 reviewed by me, shows peripheral bilateral subpleural patchy groundglass densities suggestive of a viral pneumonia or pneumonitis.  There were no significant mediastinal or hilar lymph nodes.  Most recent chest x-ray reviewed was 08/17/2019, shows some clearing but persistent patchy right basilar infiltrate not fully resolved compared with priors. She was treated with abx after the 08/02/19 CXR in case her CXR indicated superimposed bacterial PNA.    Review of Systems  Constitutional: Negative for activity change, appetite change, chills, diaphoresis, fatigue, fever and unexpected weight change.  HENT: Negative for congestion, dental problem, nosebleeds, postnasal drip, rhinorrhea, sinus pressure, sneezing, trouble swallowing and voice change.   Eyes: Negative for itching and visual disturbance.  Respiratory: Positive for cough and shortness of breath. Negative for choking, chest tightness, wheezing and stridor.   Cardiovascular: Negative for chest pain, palpitations and leg swelling.  Gastrointestinal: Negative for abdominal pain.  Musculoskeletal: Negative for joint swelling and myalgias.  Skin: Negative for rash.  Neurological: Negative for syncope,  light-headedness and headaches.  Psychiatric/Behavioral: Negative for sleep disturbance.    Past Medical History:  Diagnosis Date  . Diabetes mellitus without complication ([place])   . Hyperlipidemia   . Hypertension   . Osteopenia 11/2017   T score -1.5 FRAX 7.3% / 0.6%     Family History  Problem Relation Age of Onset  . Hypertension Father   . Cancer Maternal Uncle        colon  . Diabetes Maternal Grandfather   . Diabetes Paternal Grandmother      Social History   Socioeconomic History  . Marital status: Married    Spouse name: Not on file  . Number of children: Not on file  . Years of education: Not on file  . Highest education level: Not on file  Occupational History  . Not on file  Tobacco Use  . Smoking status: Never Smoker  . Smokeless tobacco: Never Used  Substance and Sexual Activity  . Alcohol use: Never  . Drug use: Not on file  . Sexual activity: Yes    Partners: Male    Comment: 1st intercourse- 25, partners- 60, married- 51 yrs   Other Topics Concern  . Not on file  Social History Narrative  . Not on file   Social Determinants of Health   Financial Resource Strain:   . Difficulty of Paying Living Expenses: Not on file  Food Insecurity:   . Worried About Charity fundraiser in the Last Year: Not on file  . Ran Out of Food in the Last Year: Not on file  Transportation Needs:   . Lack of Transportation (Medical): Not on file  . Lack of Transportation (Non-Medical): Not on file  Physical Activity:   . Days of Exercise per Week: Not  on file  . Minutes of Exercise per Session: Not on file  Stress:   . Feeling of Stress : Not on file  Social Connections:   . Frequency of Communication with Friends and Family: Not on file  . Frequency of Social Gatherings with Friends and Family: Not on file  . Attends Religious Services: Not on file  . Active Member of Clubs or Organizations: Not on file  . Attends Archivist Meetings: Not on file  .  Marital Status: Not on file  Intimate Partner Violence:   . Fear of Current or Ex-Partner: Not on file  . Emotionally Abused: Not on file  . Physically Abused: Not on file  . Sexually Abused: Not on file  Works as a Copy / Network engineer No exposures No military No TB exposures  No Known Allergies   Outpatient Medications Prior to Visit  Medication Sig Dispense Refill  . atorvastatin (LIPITOR) 40 MG tablet Take 40 mg by mouth daily.    . benzonatate (TESSALON) 100 MG capsule Take 100 mg by mouth 3 (three) times daily.    Marland Kitchen glimepiride (AMARYL) 4 MG tablet Take 4 mg by mouth 2 (two) times daily.     . Insulin Glargine-Lixisenatide (SOLIQUA) 100-33 UNT-MCG/ML SOPN Inject 5-38 Units into the skin 2 (two) times daily as needed. 38 units every morning and prn 5 units in evening.    Marland Kitchen lisinopril (PRINIVIL,ZESTRIL) 5 MG tablet Take 5 mg by mouth daily.    . metFORMIN (GLUCOPHAGE) 1000 MG tablet Take 1,000 mg by mouth 2 (two) times daily with a meal.     . pantoprazole (PROTONIX) 40 MG tablet Take 40 mg by mouth daily as needed.    Marland Kitchen albuterol (PROVENTIL HFA;VENTOLIN HFA) 108 (90 Base) MCG/ACT inhaler Inhale into the lungs.     No facility-administered medications prior to visit.     Past Medical History:  Diagnosis Date  . Diabetes mellitus without complication ([place])   . Hyperlipidemia   . Hypertension   . Osteopenia 11/2017   T score -1.5 FRAX 7.3% / 0.6%     Family History  Problem Relation Age of Onset  . Hypertension Father   . Cancer Maternal Uncle        colon  . Diabetes Maternal Grandfather   . Diabetes Paternal Grandmother      Social History   Socioeconomic History  . Marital status: Married    Spouse name: Not on file  . Number of children: Not on file  . Years of education: Not on file  . Highest education level: Not on file  Occupational History  . Not on file  Tobacco Use  . Smoking status: Never Smoker  . Smokeless tobacco: Never Used    Substance and Sexual Activity  . Alcohol use: Never  . Drug use: Not on file  . Sexual activity: Yes    Partners: Male    Comment: 1st intercourse- 28, partners- 59, married- 68 yrs   Other Topics Concern  . Not on file  Social History Narrative  . Not on file   Social Determinants of Health   Financial Resource Strain:   . Difficulty of Paying Living Expenses: Not on file  Food Insecurity:   . Worried About Charity fundraiser in the Last Year: Not on file  . Ran Out of Food in the Last Year: Not on file  Transportation Needs:   . Lack of Transportation (Medical): Not on file  .  Lack of Transportation (Non-Medical): Not on file  Physical Activity:   . Days of Exercise per Week: Not on file  . Minutes of Exercise per Session: Not on file  Stress:   . Feeling of Stress : Not on file  Social Connections:   . Frequency of Communication with Friends and Family: Not on file  . Frequency of Social Gatherings with Friends and Family: Not on file  . Attends Religious Services: Not on file  . Active Member of Clubs or Organizations: Not on file  . Attends Archivist Meetings: Not on file  . Marital Status: Not on file  Intimate Partner Violence:   . Fear of Current or Ex-Partner: Not on file  . Emotionally Abused: Not on file  . Physically Abused: Not on file  . Sexually Abused: Not on file     No Known Allergies   Outpatient Medications Prior to Visit  Medication Sig Dispense Refill  . atorvastatin (LIPITOR) 40 MG tablet Take 40 mg by mouth daily.    . benzonatate (TESSALON) 100 MG capsule Take 100 mg by mouth 3 (three) times daily.    Marland Kitchen glimepiride (AMARYL) 4 MG tablet Take 4 mg by mouth 2 (two) times daily.     . Insulin Glargine-Lixisenatide (SOLIQUA) 100-33 UNT-MCG/ML SOPN Inject 5-38 Units into the skin 2 (two) times daily as needed. 38 units every morning and prn 5 units in evening.    Marland Kitchen lisinopril (PRINIVIL,ZESTRIL) 5 MG tablet Take 5 mg by mouth daily.    .  metFORMIN (GLUCOPHAGE) 1000 MG tablet Take 1,000 mg by mouth 2 (two) times daily with a meal.     . pantoprazole (PROTONIX) 40 MG tablet Take 40 mg by mouth daily as needed.    Marland Kitchen albuterol (PROVENTIL HFA;VENTOLIN HFA) 108 (90 Base) MCG/ACT inhaler Inhale into the lungs.     No facility-administered medications prior to visit.        Objective:   Physical Exam Vitals:   08/30/19 0900  BP: 102/74  Pulse: (!) 110  SpO2: 99%  Weight: 157 lb (71.2 kg)  Height: 5\' 4"  (1.626 m)   Gen: Pleasant, well-nourished, in no distress,  normal affect  ENT: No lesions,  mouth clear,  oropharynx clear, no postnasal drip  Neck: No JVD, no stridor  Lungs: No use of accessory muscles, no crackles or wheezing on normal respiration, no wheeze on forced expiration  Cardiovascular: RRR, heart sounds normal, no murmur or gallops, no peripheral edema  Musculoskeletal: No deformities, no cyanosis or clubbing  Neuro: alert, awake, non focal  Skin: Warm, no lesions or rash     Assessment & Plan:  Pneumonia due to COVID-19 virus She is clinically improved, almost back to baseline except for some residual fatigue.  Her chest x-ray had still not normalized by 08/17/2019.  She may end up with some persistent scar, or they may just be a prolonged period of resolution.  She needs a repeat CT scan of the chest this month to compare with November.  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  We will need to do close surveillance to ensure that she does not begin to develop ILD in the aftermath of her illness.  If so I will refer her to our ILD clinic for follow-up and possible antifibrotic's.  We will perform pulmonary function testing to establish your baseline lung function We will repeat your CT scan of the chest in February to compare with  your films from November. Follow-up with Dr. Lamonte Sakai after your testing to review the results together   Baltazar Apo,  MD, PhD 08/30/2019, 9:33 AM [facility] [phone] or if no answer [phone]

## 2019-08-30 NOTE — Patient Instructions (Signed)
We will perform pulmonary function testing to establish your baseline lung function We will repeat your CT scan of the chest in February to compare with your films from November. Follow-up with Dr. Lamonte Sakai after your testing to review the results together.

## 2019-08-30 NOTE — Telephone Encounter (Signed)
LVMTCB x 1 for patient. Advised in message trying to reach her to set up covid testing required for PFT that was scheduled on 10/22/19.  Advised for her to call our office back to set up or contact via mychart to advise if 10/19/19 either AM or PM will work best for her. Advised in message testing has to be done 3 - 4 days before the PFT.

## 2019-09-01 ENCOUNTER — Encounter: Payer: Self-pay | Admitting: *Deleted

## 2019-09-01 NOTE — Telephone Encounter (Signed)
Yes, I am ok with writing this letter.

## 2019-09-01 NOTE — Telephone Encounter (Signed)
Dr Lamonte Sakai, Patient is asking for a note excusing her from work so that she can effectively quarantine after her Covid testing on 10/18/19 in preparation for her PFT on 10/22/19.  Is this okay to do?  Thank you!

## 2019-09-10 ENCOUNTER — Inpatient Hospital Stay: Admission: RE | Admit: 2019-09-10 | Payer: BC Managed Care – PPO | Source: Ambulatory Visit

## 2019-10-18 ENCOUNTER — Other Ambulatory Visit (HOSPITAL_COMMUNITY)
Admission: RE | Admit: 2019-10-18 | Discharge: 2019-10-18 | Disposition: A | Discharge status: Home / Self Care | Payer: BC Managed Care – PPO | Source: Ambulatory Visit | Attending: Emergency Medicine | Admitting: Emergency Medicine

## 2019-10-18 DIAGNOSIS — Z01812 Encounter for preprocedural laboratory examination: Secondary | ICD-10-CM | POA: Diagnosis not present

## 2019-10-18 DIAGNOSIS — Z20822 Contact with and (suspected) exposure to covid-19: Secondary | ICD-10-CM | POA: Insufficient documentation

## 2019-10-19 LAB — SARS CORONAVIRUS 2 (TAT 6-24 HRS): SARS Coronavirus 2: NEGATIVE

## 2019-10-21 ENCOUNTER — Other Ambulatory Visit (INDEPENDENT_AMBULATORY_CARE_PROVIDER_SITE_OTHER): Payer: BC Managed Care – PPO

## 2019-10-21 DIAGNOSIS — U071 COVID-19: Secondary | ICD-10-CM | POA: Diagnosis not present

## 2019-10-21 DIAGNOSIS — J1282 Pneumonia due to coronavirus disease 2019: Secondary | ICD-10-CM | POA: Diagnosis not present

## 2019-10-21 LAB — BASIC METABOLIC PANEL
BUN: 14 mg/dL (ref 6–23)
CO2: 29 mEq/L (ref 19–32)
Calcium: 9.5 mg/dL (ref 8.4–10.5)
Chloride: 101 mEq/L (ref 96–112)
Creatinine, Ser: 0.6 mg/dL (ref 0.40–1.20)
GFR: 102.3 mL/min (ref >60.00)
Glucose, Bld: 228 mg/dL — ABNORMAL HIGH (ref 70–99)
Potassium: 3.9 mEq/L (ref 3.5–5.1)
Sodium: 136 mEq/L (ref 135–145)

## 2019-10-22 ENCOUNTER — Ambulatory Visit (INDEPENDENT_AMBULATORY_CARE_PROVIDER_SITE_OTHER): Payer: BC Managed Care – PPO | Admitting: Emergency Medicine

## 2019-10-22 ENCOUNTER — Other Ambulatory Visit: Payer: Self-pay

## 2019-10-22 ENCOUNTER — Ambulatory Visit (INDEPENDENT_AMBULATORY_CARE_PROVIDER_SITE_OTHER)
Admission: RE | Admit: 2019-10-22 | Discharge: 2019-10-22 | Disposition: A | Discharge status: Home / Self Care | Payer: BC Managed Care – PPO | Source: Ambulatory Visit | Attending: Emergency Medicine | Admitting: Emergency Medicine

## 2019-10-22 DIAGNOSIS — U071 COVID-19: Secondary | ICD-10-CM

## 2019-10-22 DIAGNOSIS — J1282 Pneumonia due to coronavirus disease 2019: Secondary | ICD-10-CM | POA: Diagnosis not present

## 2019-10-22 LAB — PULMONARY FUNCTION TEST
FEF 25-75 Post: 3.9 L/s
FEF 25-75 Pre: 3.29 L/s
FEF2575-%Change-Post: 18 %
FEF2575-%Pred-Post: 165 %
FEF2575-%Pred-Pre: 139 %
FEV1-%Change-Post: 2 %
FEV1-%Pred-Post: 83 %
FEV1-%Pred-Pre: 81 %
FEV1-Post: 2.12 L
FEV1-Pre: 2.07 L
FEV1FVC-%Change-Post: 2 %
FEV1FVC-%Pred-Pre: 113 %
FEV6-%Change-Post: 0 %
FEV6-%Pred-Post: 74 %
FEV6-%Pred-Pre: 73 %
FEV6-Post: 2.34 L
FEV6-Pre: 2.33 L
FEV6FVC-%Pred-Post: 103 %
FEV6FVC-%Pred-Pre: 103 %
FVC-%Change-Post: 0 %
FVC-%Pred-Post: 71 %
FVC-%Pred-Pre: 71 %
FVC-Post: 2.34 L
FVC-Pre: 2.33 L
Post FEV1/FVC ratio: 91 %
Post FEV6/FVC ratio: 100 %
Pre FEV1/FVC ratio: 89 %
Pre FEV6/FVC Ratio: 100 %
RV % pred: 48 %
RV: 0.94 L
TLC % pred: 73 %
TLC: 3.64 L

## 2019-10-22 MED ORDER — IOPAMIDOL (ISOVUE-M 300) INJECTION 61%: 15.0000 mL | Freq: Once | INTRAMUSCULAR | Status: DC | PRN

## 2019-10-22 MED ORDER — IOHEXOL 300 MG/ML  SOLN
75.0000 mL | Freq: Once | INTRAMUSCULAR | Status: AC | PRN
  Administered 2019-10-22: 75 mL via INTRAVENOUS

## 2019-10-22 NOTE — Progress Notes (Signed)
PFT completed today.  

## 2019-10-27 NOTE — Telephone Encounter (Signed)
Please let her know that I have reviewed her PFT and her CT chest.  The PFT show evidence for a mild restriction on the size of each breath that she takes, with a slightly decreased lung capacity.  No evidence for obstruction, asthma, COPD, etc.  The CT scan of the chest is reassuring.  There is no evidence for scarring or any residual injury from her COVID-19 pneumonitis.  This is good news.  Based on the reassuring CT scan I do not believe her mild restriction is related to Covid or scarring.  Common reasons for restrictive lung disease can include abdominal compliance/carrying too much weight.  We should review and discuss further at her OV.

## 2019-10-27 NOTE — Telephone Encounter (Signed)
RB, please advise on pt email, thanks!   have not heard results of PFT and would like to hear doctors opinion of the chest CT.  Also was insurance filed with a diagnosis of follow up Covid on the CT?  They will waiver deductible since it was because of Covid. Thank you.

## 2019-11-15 ENCOUNTER — Other Ambulatory Visit: Payer: Self-pay

## 2019-11-15 ENCOUNTER — Ambulatory Visit (INDEPENDENT_AMBULATORY_CARE_PROVIDER_SITE_OTHER): Payer: BC Managed Care – PPO | Admitting: Emergency Medicine

## 2019-11-15 ENCOUNTER — Encounter: Payer: Self-pay | Admitting: Emergency Medicine

## 2019-11-15 DIAGNOSIS — J1282 Pneumonia due to coronavirus disease 2019: Secondary | ICD-10-CM

## 2019-11-15 DIAGNOSIS — U071 COVID-19: Secondary | ICD-10-CM

## 2019-11-15 NOTE — Progress Notes (Signed)
Subjective:    Patient ID: Hannah Cantrell, female    DOB: 03-21-1961, 59 y.o.   MRN: [id number]  HPI Pleasant 59 year old woman, never smoker, with a history of diabetes, hyperlipidemia, hypertension on lisinopril.  She was diagnosed with COVID-19 pneumonitis in November 2020.  She was hospitalized 11/15-11/19, received remdesivir, dexamethasone, appropriate anticoagulation.  She was discharged home on room air. She has continued to have some neck pain / muscle tightness, some peripheral neuropathy. her exertional tolerance and her breathing have almost gone back to normal. She has some rare cough that she ascribes to rhinitis - about normal for her. Maybe a bit more fatigued. She checks her SpO2 and has not desaturated.   CT chest 06/15/2019 reviewed by me, shows peripheral bilateral subpleural patchy groundglass densities suggestive of a viral pneumonia or pneumonitis.  There were no significant mediastinal or hilar lymph nodes.  Most recent chest x-ray reviewed was 08/17/2019, shows some clearing but persistent patchy right basilar infiltrate not fully resolved compared with priors. She was treated with abx after the 08/02/19 CXR in case her CXR indicated superimposed bacterial PNA.   ROV 11/15/2019 --follow-up visit 59 year old never smoker with hypertension, hyperlipidemia, diabetes who was diagnosed and treated for COVID-19 pneumonitis in November 2020.  She has been dealing with residual symptoms of myalgias, peripheral neuropathy, fatigue, slowly improving.   She underwent pulmonary function testing on 10/22/2019 which I have reviewed, shows restriction without any evidence of obstruction.  She was unable to do the DLCO.  A repeat CT scan of her chest done on 10/22/2019 was reviewed by me, shows no evidence of residual groundglass, no interstitial disease.  Reassuring study. COVID vaccine up to date.   MDM: Reviewed CT scan of the chest from 10/22/2019 Reviewed PFT from 10/22/2019 Reviewed  clinic notes from 11/04/2019, 09/28/2019   Review of Systems  Constitutional: Negative for activity change, appetite change, chills, diaphoresis, fatigue, fever and unexpected weight change.  HENT: Negative for congestion, dental problem, nosebleeds, postnasal drip, rhinorrhea, sinus pressure, sneezing, trouble swallowing and voice change.   Eyes: Negative for itching and visual disturbance.  Respiratory: Positive for cough and shortness of breath. Negative for choking, chest tightness, wheezing and stridor.   Cardiovascular: Negative for chest pain, palpitations and leg swelling.  Gastrointestinal: Negative for abdominal pain.  Musculoskeletal: Negative for joint swelling and myalgias.  Skin: Negative for rash.  Neurological: Negative for syncope, light-headedness and headaches.  Psychiatric/Behavioral: Negative for sleep disturbance.       Objective:   Physical Exam Vitals:   11/15/19 1505  BP: (!) 118/58  Pulse: 96  Temp: 97.9 F (36.6 C)  TempSrc: Temporal  SpO2: 96%  Weight: 160 lb (72.6 kg)  Height: 5\' 4"  (1.626 m)   Gen: Pleasant, well-nourished, in no distress,  normal affect  ENT: No lesions,  mouth clear,  oropharynx clear, no postnasal drip  Neck: No JVD, no stridor  Lungs: No use of accessory muscles, no crackles or wheezing on normal respiration, no wheeze on forced expiration  Cardiovascular: RRR, heart sounds normal, no murmur or gallops, no peripheral edema  Musculoskeletal: No deformities, no cyanosis or clubbing  Neuro: alert, awake, non focal  Skin: Warm, no lesions or rash     Assessment & Plan:  Pneumonia due to COVID-19 virus She is clinically back to baseline.  Pulmonary function testing did not show any evidence of obstructive lung disease.  She did have some restriction.  Based on her clear CT chest without any  evidence of ILD, scar, the restriction is likely due to obesity and deconditioning.  No other work-up needed unless she has a clinical  change.   Baltazar Apo, MD, PhD 11/15/2019, 3:23 PM [facility] [phone] or if no answer [phone]

## 2019-11-15 NOTE — Assessment & Plan Note (Signed)
She is clinically back to baseline.  Pulmonary function testing did not show any evidence of obstructive lung disease.  She did have some restriction.  Based on her clear CT chest without any evidence of ILD, scar, the restriction is likely due to obesity and deconditioning.  No other work-up needed unless she has a clinical change.

## 2019-11-15 NOTE — Patient Instructions (Signed)
Your CT scan of the chest and pulmonary function testing are both reassuring.  They do not show any evidence of scarring or asthma following your episode of Covid pneumonitis.  This is good news. Continue to exercise, stay in shape and lose weight.  This will help your breathing. Follow with Dr. Lamonte Sakai if you have any changes in your breathing or any other respiratory symptoms so that we can evaluate further.

## 2019-12-10 ENCOUNTER — Telehealth: Payer: Self-pay | Admitting: *Deleted

## 2019-12-10 NOTE — Telephone Encounter (Signed)
Patient called to report she had spotting when wiping only on 11/26/19. No further bleeding since then or spotting, no pain. Patient annual exam scheduled on 01/26/20, asked if okay to wait and follow up with Dr.Lavoie at this visit. I explained that is fine,but if any further bleeding to schedule OV sooner and not wait until annual exam visit. Patient verbalized she understood.

## 2019-12-28 ENCOUNTER — Other Ambulatory Visit: Payer: Self-pay | Admitting: Obstetrics & Gynecology

## 2019-12-28 DIAGNOSIS — Z1231 Encounter for screening mammogram for malignant neoplasm of breast: Secondary | ICD-10-CM

## 2020-01-07 ENCOUNTER — Encounter: Payer: BC Managed Care – PPO | Admitting: Obstetrics & Gynecology

## 2020-01-25 ENCOUNTER — Other Ambulatory Visit: Payer: Self-pay

## 2020-01-26 ENCOUNTER — Encounter: Payer: Self-pay | Admitting: Obstetrics & Gynecology

## 2020-01-26 ENCOUNTER — Ambulatory Visit (INDEPENDENT_AMBULATORY_CARE_PROVIDER_SITE_OTHER): Payer: BC Managed Care – PPO | Admitting: Obstetrics & Gynecology

## 2020-01-26 VITALS — BP 118/80 | Ht 64.0 in | Wt 160.0 lb

## 2020-01-26 DIAGNOSIS — Z01419 Encounter for gynecological examination (general) (routine) without abnormal findings: Secondary | ICD-10-CM | POA: Diagnosis not present

## 2020-01-26 DIAGNOSIS — M8589 Other specified disorders of bone density and structure, multiple sites: Secondary | ICD-10-CM | POA: Diagnosis not present

## 2020-01-26 DIAGNOSIS — N950 Postmenopausal bleeding: Secondary | ICD-10-CM

## 2020-01-26 DIAGNOSIS — Z780 Asymptomatic menopausal state: Secondary | ICD-10-CM | POA: Diagnosis not present

## 2020-01-26 DIAGNOSIS — E663 Overweight: Secondary | ICD-10-CM

## 2020-01-26 NOTE — Progress Notes (Signed)
Hannah Cantrell 20-Aug-1960 [id number]   History:    59 y.o. G2P2L2 Married.  Daughter 8 yo, son 11 yo.  RP:  Established patient presenting for annual gyn exam   HPI: Postmenopause, well on no HRT.  Mild PMB x 1 in 10/2019.  No pelvic pain.  No pain with IC.  Urine/BMs normal.  Breasts normal. Had a Left breast Bx 07/2019, benign.  BMI 27.46.  Exercising regularly, walking.  DM treated with Insulin since last year. Health labs with Fam MD.  Past medical history,surgical history, family history and social history were all reviewed and documented in the EPIC chart.  Gynecologic History No LMP recorded. Patient is postmenopausal.  Obstetric History OB History  Gravida Para Term Preterm AB Living  2 2       2   SAB TAB Ectopic Multiple Live Births               # Outcome Date GA Lbr Len/2nd Weight Sex Delivery Anes PTL Lv  2 Para           1 Para              ROS: A ROS was performed and pertinent positives and negatives are included in the history.  GENERAL: No fevers or chills. HEENT: No change in vision, no earache, sore throat or sinus congestion. NECK: No pain or stiffness. CARDIOVASCULAR: No chest pain or pressure. No palpitations. PULMONARY: No shortness of breath, cough or wheeze. GASTROINTESTINAL: No abdominal pain, nausea, vomiting or diarrhea, melena or bright red blood per rectum. GENITOURINARY: No urinary frequency, urgency, hesitancy or dysuria. MUSCULOSKELETAL: No joint or muscle pain, no back pain, no recent trauma. DERMATOLOGIC: No rash, no itching, no lesions. ENDOCRINE: No polyuria, polydipsia, no heat or cold intolerance. No recent change in weight. HEMATOLOGICAL: No anemia or easy bruising or bleeding. NEUROLOGIC: No headache, seizures, numbness, tingling or weakness. PSYCHIATRIC: No depression, no loss of interest in normal activity or change in sleep pattern.     Exam:   BP 118/80 (BP Location: Right Arm, Patient Position: Sitting, Cuff Size: Normal)    Ht 5\' 4"  (1.626 m)   Wt 160 lb (72.6 kg)   BMI 27.46 kg/m   Body mass index is 27.46 kg/m.  General appearance : Well developed well nourished female. No acute distress HEENT: Eyes: no retinal hemorrhage or exudates,  Neck supple, trachea midline, no carotid bruits, no thyroidmegaly Lungs: Clear to auscultation, no rhonchi or wheezes, or rib retractions  Heart: Regular rate and rhythm, no murmurs or gallops Breast:Examined in sitting and supine position were symmetrical in appearance, no palpable masses or tenderness,  no skin retraction, no nipple inversion, no nipple discharge, no skin discoloration, no axillary or supraclavicular lymphadenopathy Abdomen: no palpable masses or tenderness, no rebound or guarding Extremities: no edema or skin discoloration or tenderness  Pelvic: Vulva: Normal             Vagina: No gross lesions or discharge  Cervix: No gross lesions or discharge.  Pap reflex done.  Uterus  AV, normal size, shape and consistency, non-tender and mobile  Adnexa  Without masses or tenderness  Anus: Normal   Assessment/Plan:  59 y.o. female for annual exam   1. Encounter for routine gynecological examination with Papanicolaou smear of cervix Normal gynecologic exam in menopause.  Pap reflex done.  Breast exam normal.  Screening mammogram January 2021 was negative.  Colonoscopy 3 years ago.  Health labs with family physician.  2. Postmenopause Postmenopausal, well on no hormone replacement therapy.  Mild postmenopausal spotting 2 months ago.  3. Postmenopausal bleeding Mild postmenopausal bleeding 2 months ago.  Will investigate with a pelvic ultrasound to evaluate the endometrial lining and rule out endometrial pathology.  Gynecologic exam today was normal. - US Transvaginal Non-OB; Future  4. Osteopenia of multiple sites Osteopenia on bone density May 2019.  Schedule bone density now.  Continue with vitamin D supplements and calcium intake of 1200 mg daily.  Regular  weightbearing physical activity is recommended. - DG Bone Density; Future  5. Overweight (BMI 25.0-29.9) Recommend a lower calorie/carb diet such as [employer].  Aerobic activities 5 times a week and light weightlifting every 2 days.  Other orders - Semaglutide,0.25 or 0.5MG /DOS, (OZEMPIC, 0.25 OR 0.5 MG/DOSE,) 2 MG/1.5ML SOPN; Inject into the skin as directed. - metoprolol tartrate (LOPRESSOR) 25 MG tablet; Take 1 tablet by mouth in the morning and at bedtime.  Princess Bruins MD, 8:25 AM 01/26/2020

## 2020-01-28 LAB — PAP IG W/ RFLX HPV ASCU

## 2020-01-29 ENCOUNTER — Encounter: Payer: Self-pay | Admitting: Obstetrics & Gynecology

## 2020-01-29 NOTE — Patient Instructions (Signed)
1. Encounter for routine gynecological examination with Papanicolaou smear of cervix Normal gynecologic exam in menopause.  Pap reflex done.  Breast exam normal.  Screening mammogram January 2021 was negative.  Colonoscopy 3 years ago.  Health labs with family physician.  2. Postmenopause Postmenopausal, well on no hormone replacement therapy.  Mild postmenopausal spotting 2 months ago.  3. Postmenopausal bleeding Mild postmenopausal bleeding 2 months ago.  Will investigate with a pelvic ultrasound to evaluate the endometrial lining and rule out endometrial pathology.  Gynecologic exam today was normal. - US Transvaginal Non-OB; Future  4. Osteopenia of multiple sites Osteopenia on bone density May 2019.  Schedule bone density now.  Continue with vitamin D supplements and calcium intake of 1200 mg daily.  Regular weightbearing physical activity is recommended. - DG Bone Density; Future  5. Overweight (BMI 25.0-29.9) Recommend a lower calorie/carb diet such as [employer].  Aerobic activities 5 times a week and light weightlifting every 2 days.  Other orders - Semaglutide,0.25 or 0.5MG /DOS, (OZEMPIC, 0.25 OR 0.5 MG/DOSE,) 2 MG/1.5ML SOPN; Inject into the skin as directed. - metoprolol tartrate (LOPRESSOR) 25 MG tablet; Take 1 tablet by mouth in the morning and at bedtime.  Hannah Cantrell, it was a pleasure seeing you today!  I will inform you of your results as soon as they are available.

## 2020-02-11 ENCOUNTER — Ambulatory Visit
Admission: RE | Admit: 2020-02-11 | Discharge: 2020-02-11 | Disposition: A | Discharge status: Home / Self Care | Payer: BC Managed Care – PPO | Source: Ambulatory Visit | Attending: Obstetrics & Gynecology | Admitting: Obstetrics & Gynecology

## 2020-02-11 ENCOUNTER — Other Ambulatory Visit: Payer: Self-pay

## 2020-02-11 DIAGNOSIS — Z1231 Encounter for screening mammogram for malignant neoplasm of breast: Secondary | ICD-10-CM

## 2020-02-17 ENCOUNTER — Other Ambulatory Visit: Payer: Self-pay

## 2020-02-17 ENCOUNTER — Ambulatory Visit (INDEPENDENT_AMBULATORY_CARE_PROVIDER_SITE_OTHER): Payer: BC Managed Care – PPO

## 2020-02-17 ENCOUNTER — Encounter: Payer: Self-pay | Admitting: Obstetrics & Gynecology

## 2020-02-17 ENCOUNTER — Ambulatory Visit: Payer: BC Managed Care – PPO | Admitting: Obstetrics & Gynecology

## 2020-02-17 DIAGNOSIS — N950 Postmenopausal bleeding: Secondary | ICD-10-CM

## 2020-02-17 DIAGNOSIS — D252 Subserosal leiomyoma of uterus: Secondary | ICD-10-CM | POA: Diagnosis not present

## 2020-02-17 DIAGNOSIS — N854 Malposition of uterus: Secondary | ICD-10-CM | POA: Diagnosis not present

## 2020-02-17 DIAGNOSIS — D251 Intramural leiomyoma of uterus: Secondary | ICD-10-CM

## 2020-02-17 NOTE — Progress Notes (Signed)
    Makayah Pauli July 22, 1961 [id number]        59 y.o.  G2P2L2  RP: PMB in 10/2019 for Pelvic US  HPI: Postmenopause, well on no HRT.  MildPMB x 1 in 10/2019. No pelvic pain. No pain with IC. Urine/BMs normal.    OB History  Gravida Para Term Preterm AB Living  2 2       2   SAB TAB Ectopic Multiple Live Births               # Outcome Date GA Lbr Len/2nd Weight Sex Delivery Anes PTL Lv  2 Para           1 Para             Past medical history,surgical history, problem list, medications, allergies, family history and social history were all reviewed and documented in the EPIC chart.   Directed ROS with pertinent positives and negatives documented in the history of present illness/assessment and plan.  Exam:  There were no vitals filed for this visit. General appearance:  Normal  Pelvic US today: T/V images.  Anteverted uterus normal in size and shape with multiple small intramural and subserosal fibroids with the largest at 1 cm.  The overall uterine size is measured at 6.37 x 3.9 x 3.08 cm.  The endometrial lining is thin and symmetrical with no mass or thickening seen, measured at 1.66 mm.  Both ovaries are normal in size with a thick-walled avascular collapsed follicular type cyst measured at 9 mm.  No adnexal mass.  No free fluid in the posterior cul-de-sac.   Assessment/Plan:  59 y.o. G2P2   1. Postmenopausal bleeding Mild postmenopausal bleeding x1 in April 2021, no recurrence since then.  Pelvic ultrasound findings thoroughly reviewed with patient.  Patient reassured that the endometrial lining is normal and thin at 1.66 mm.  Informed of very small uterine myomas with an overall normal uterine size.  Normal ovaries bilaterally.   Princess Bruins MD, 9:26 AM 02/17/2020

## 2020-02-22 ENCOUNTER — Other Ambulatory Visit: Payer: Self-pay | Admitting: Obstetrics & Gynecology

## 2020-02-22 ENCOUNTER — Other Ambulatory Visit: Payer: Self-pay

## 2020-02-22 ENCOUNTER — Ambulatory Visit (INDEPENDENT_AMBULATORY_CARE_PROVIDER_SITE_OTHER): Payer: BC Managed Care – PPO

## 2020-02-22 DIAGNOSIS — Z780 Asymptomatic menopausal state: Secondary | ICD-10-CM

## 2020-02-22 DIAGNOSIS — M8589 Other specified disorders of bone density and structure, multiple sites: Secondary | ICD-10-CM

## 2020-02-22 DIAGNOSIS — M85852 Other specified disorders of bone density and structure, left thigh: Secondary | ICD-10-CM

## 2021-01-10 ENCOUNTER — Other Ambulatory Visit: Payer: Self-pay | Admitting: Family Medicine

## 2021-01-10 DIAGNOSIS — Z1231 Encounter for screening mammogram for malignant neoplasm of breast: Secondary | ICD-10-CM

## 2021-02-06 ENCOUNTER — Ambulatory Visit (INDEPENDENT_AMBULATORY_CARE_PROVIDER_SITE_OTHER): Payer: BC Managed Care – PPO | Admitting: Obstetrics & Gynecology

## 2021-02-06 ENCOUNTER — Other Ambulatory Visit: Payer: Self-pay

## 2021-02-06 ENCOUNTER — Encounter: Payer: Self-pay | Admitting: Obstetrics & Gynecology

## 2021-02-06 VITALS — BP 110/72 | HR 84 | Resp 16 | Ht 62.25 in | Wt 157.0 lb

## 2021-02-06 DIAGNOSIS — M85852 Other specified disorders of bone density and structure, left thigh: Secondary | ICD-10-CM | POA: Diagnosis not present

## 2021-02-06 DIAGNOSIS — E663 Overweight: Secondary | ICD-10-CM

## 2021-02-06 DIAGNOSIS — R300 Dysuria: Secondary | ICD-10-CM | POA: Diagnosis not present

## 2021-02-06 DIAGNOSIS — Z01419 Encounter for gynecological examination (general) (routine) without abnormal findings: Secondary | ICD-10-CM

## 2021-02-06 DIAGNOSIS — Z780 Asymptomatic menopausal state: Secondary | ICD-10-CM

## 2021-02-06 LAB — URINALYSIS, COMPLETE W/RFL CULTURE
Bacteria, UA: NONE SEEN /HPF
Bilirubin Urine: NEGATIVE
Glucose, UA: NEGATIVE
Hgb urine dipstick: NEGATIVE
Hyaline Cast: NONE SEEN /LPF
Ketones, ur: NEGATIVE
Leukocyte Esterase: NEGATIVE
Nitrites, Initial: NEGATIVE
Protein, ur: NEGATIVE
RBC / HPF: NONE SEEN /HPF (ref 0–2)
Specific Gravity, Urine: 1.004 (ref 1.001–1.035)
WBC, UA: NONE SEEN /HPF (ref 0–5)
pH: 5.5 (ref 5.0–8.0)

## 2021-02-06 LAB — NO CULTURE INDICATED

## 2021-02-06 NOTE — Progress Notes (Addendum)
Hannah Cantrell 06/15/1961 [id number]   History:    60 y.o. G2P2L2 Married.  Daughter 87 yo, son 101 yo.   RP:  Established patient presenting for annual gyn exam   HPI: Postmenopause, well on no HRT.  No PMB x 10/2019, pelvic US 01/2020 showed a thin endometrial line.  Occasional vaginal/vulvar irritation, no symptom currently.  No pelvic pain.  No pain with IC.  Urine/BMs normal.  Breasts normal. Had a Left breast Bx 07/2019, benign.  BMI 28.49.  Exercising regularly, walking.  DM treated with Insulin since last year. Health labs with Fam MD.   Pelvic US 02/17/2020: T/V images.  Anteverted uterus normal in size and shape with multiple small intramural and subserosal fibroids with the largest at 1 cm.  The overall uterine size is measured at 6.37 x 3.9 x 3.08 cm.  The endometrial lining is thin and symmetrical with no mass or thickening seen, measured at 1.66 mm.  Both ovaries are normal in size with a thick-walled avascular collapsed follicular type cyst measured at 9 mm.  No adnexal mass.  No free fluid in the posterior cul-de-sac.   Past medical history,surgical history, family history and social history were all reviewed and documented in the EPIC chart.  Gynecologic History No LMP recorded. Patient is postmenopausal.  Obstetric History OB History  Gravida Para Term Preterm AB Living  2 2       2   SAB IAB Ectopic Multiple Live Births               # Outcome Date GA Lbr Len/2nd Weight Sex Delivery Anes PTL Lv  2 Para           1 Para              ROS: A ROS was performed and pertinent positives and negatives are included in the history.  GENERAL: No fevers or chills. HEENT: No change in vision, no earache, sore throat or sinus congestion. NECK: No pain or stiffness. CARDIOVASCULAR: No chest pain or pressure. No palpitations. PULMONARY: No shortness of breath, cough or wheeze. GASTROINTESTINAL: No abdominal pain, nausea, vomiting or diarrhea, melena or bright red blood per  rectum. GENITOURINARY: No urinary frequency, urgency, hesitancy or dysuria. MUSCULOSKELETAL: No joint or muscle pain, no back pain, no recent trauma. DERMATOLOGIC: No rash, no itching, no lesions. ENDOCRINE: No polyuria, polydipsia, no heat or cold intolerance. No recent change in weight. HEMATOLOGICAL: No anemia or easy bruising or bleeding. NEUROLOGIC: No headache, seizures, numbness, tingling or weakness. PSYCHIATRIC: No depression, no loss of interest in normal activity or change in sleep pattern.     Exam:   BP 110/72   Pulse 84   Resp 16   Ht 5' 2.25" (1.581 m)   Wt 157 lb (71.2 kg)   BMI 28.49 kg/m   Body mass index is 28.49 kg/m.  General appearance : Well developed well nourished female. No acute distress HEENT: Eyes: no retinal hemorrhage or exudates,  Neck supple, trachea midline, no carotid bruits, no thyroidmegaly Lungs: Clear to auscultation, no rhonchi or wheezes, or rib retractions  Heart: Regular rate and rhythm, no murmurs or gallops Breast:Examined in sitting and supine position were symmetrical in appearance, no palpable masses or tenderness,  no skin retraction, no nipple inversion, no nipple discharge, no skin discoloration, no axillary or supraclavicular lymphadenopathy Abdomen: no palpable masses or tenderness, no rebound or guarding Extremities: no edema or skin discoloration or tenderness  Pelvic: Vulva: Normal  Vagina: No gross lesions or discharge  Cervix: No gross lesions or discharge  Uterus  AV, normal size, shape and consistency, non-tender and mobile  Adnexa  Without masses or tenderness  Anus: Normal  U/A completely negative   Assessment/Plan:  60 y.o. female for annual exam   1. Well female exam with routine gynecological exam Normal gynecologic exam.  Pap test in June 2021 was negative, will repeat at 2 to 3 years.  Breast exam normal.  Screening mammogram is scheduled for July 2022.  Colonoscopy in 2018.  Health labs with family  physician.  2. Postmenopause Well on no hormone replacement therapy.  No recent postmenopausal bleeding.  Pelvic ultrasound July 2021 showed a very thin normal endometrial lining.  3. Osteopenia of left femoral neck Very mild osteopenia only at the left femoral neck on bone density in 2021.  Recommend repeating a bone density at age 35.  Continue with vitamin D supplements, calcium intake of 1.5 g daily total and regular weightbearing physical activities to continue.  4. Dysuria Occasional discomfort with urination.  Will rule out an acute cystitis with a urine analysis today.  Urine analysis Negative. - Urinalysis,Complete w/RFL Culture  5. Overweight (BMI 25.0-29.9) Recommend a lower calorie/carb diet.  Aerobic activities 5 times a week and light weightlifting every 2 days.  Other orders - OZEMPIC, 1 MG/DOSE, 4 MG/3ML SOPN; Inject into the skin. - rosuvastatin (CRESTOR) 40 MG tablet; Take 40 mg by mouth daily. - ONETOUCH ULTRA test strip - albuterol (VENTOLIN HFA) 108 (90 Base) MCG/ACT inhaler; Inhale into the lungs.   Princess Bruins MD, 8:26 AM 02/06/2021

## 2021-03-01 ENCOUNTER — Ambulatory Visit
Admission: RE | Admit: 2021-03-01 | Discharge: 2021-03-01 | Disposition: A | Discharge status: Home / Self Care | Payer: BC Managed Care – PPO | Source: Ambulatory Visit | Attending: Family Medicine | Admitting: Family Medicine

## 2021-03-01 ENCOUNTER — Other Ambulatory Visit: Payer: Self-pay

## 2021-03-01 DIAGNOSIS — Z1231 Encounter for screening mammogram for malignant neoplasm of breast: Secondary | ICD-10-CM

## 2021-04-24 IMAGING — CR DG CHEST 2V
2 series · 2 of 2 positions shown · non-contrast
Comparison: Chest radiograph June 13, 2019 and chest CT
June 15, 2019

CLINICAL DATA: Recent pneumonia

EXAM:
CHEST - 2 VIEW

[w chest pa]
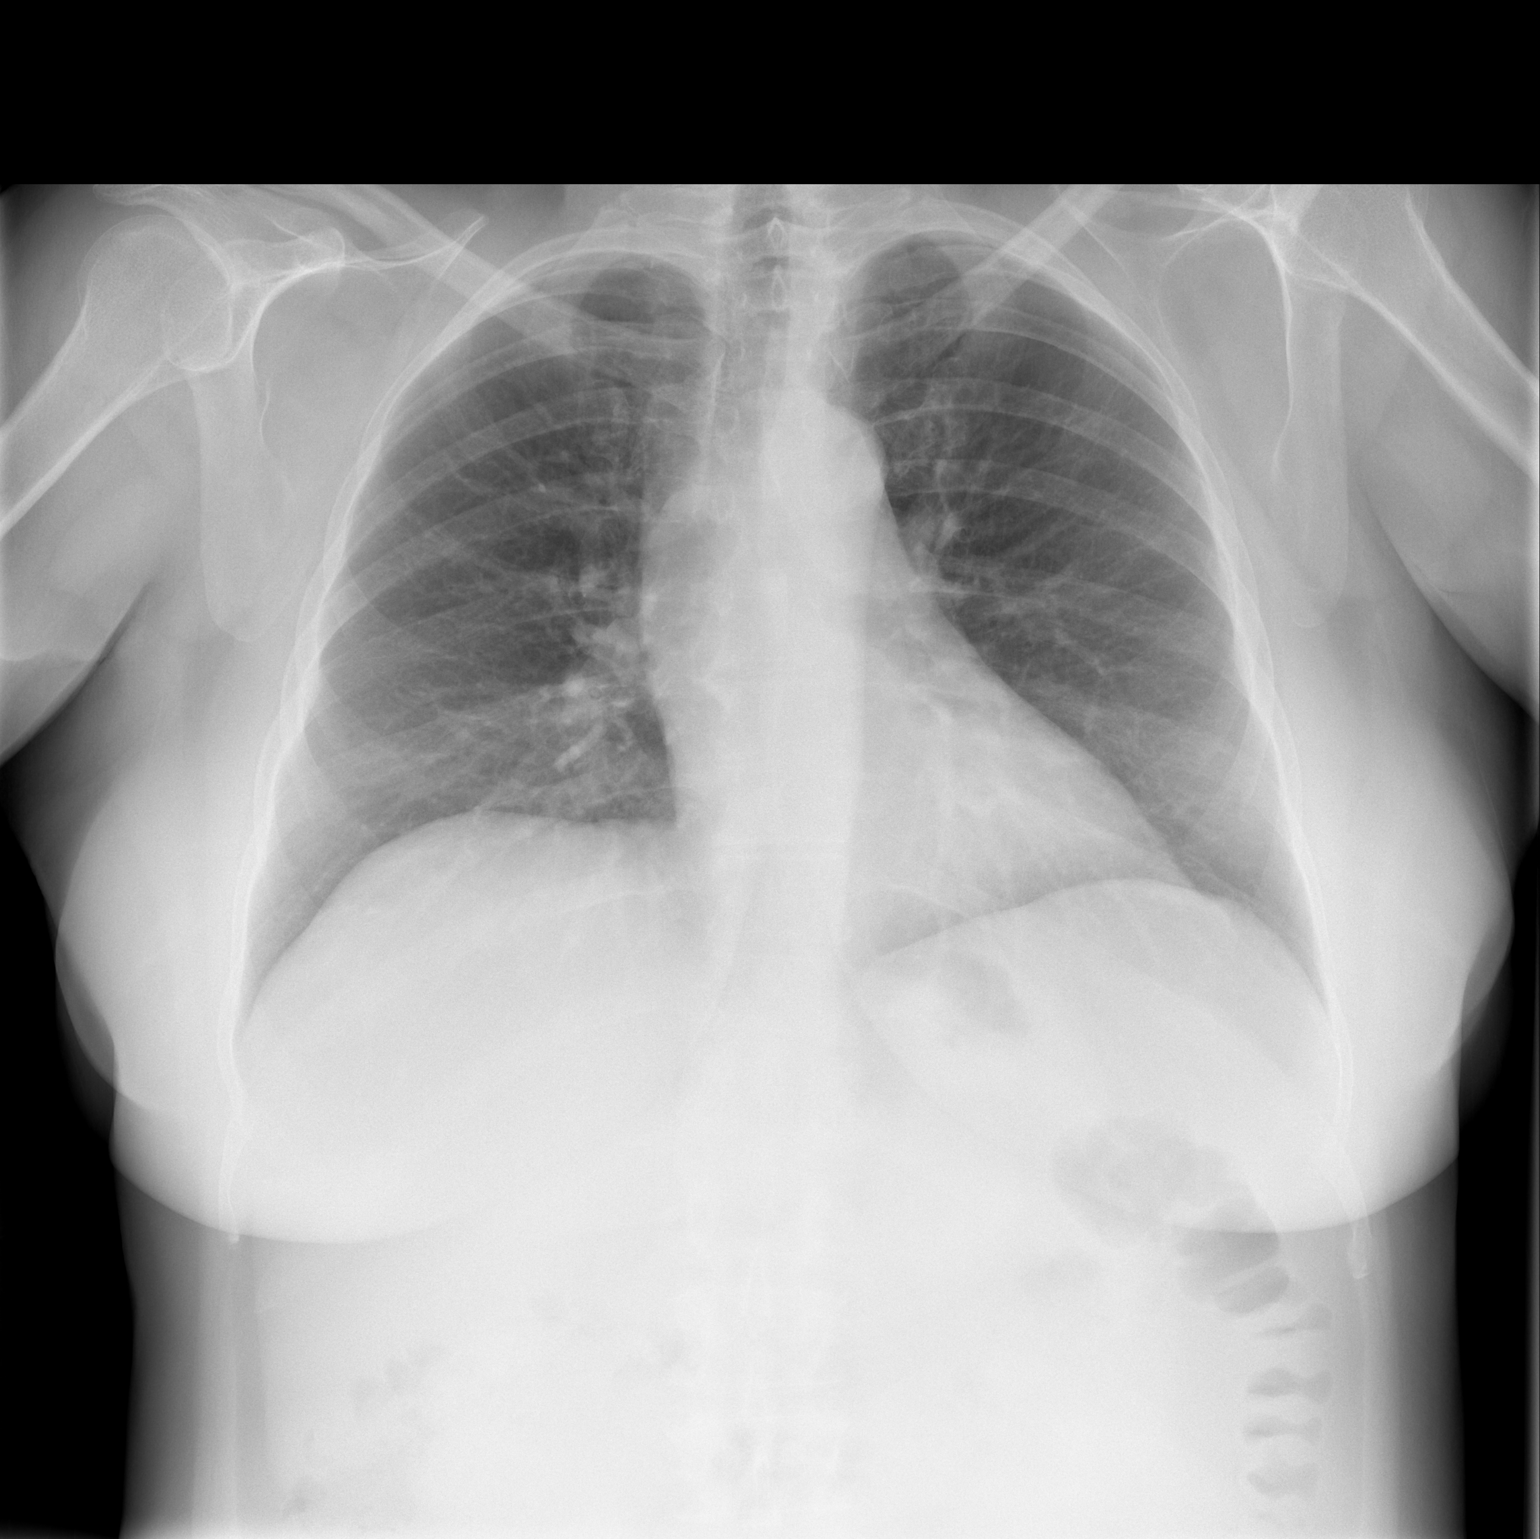

[w chest lat]
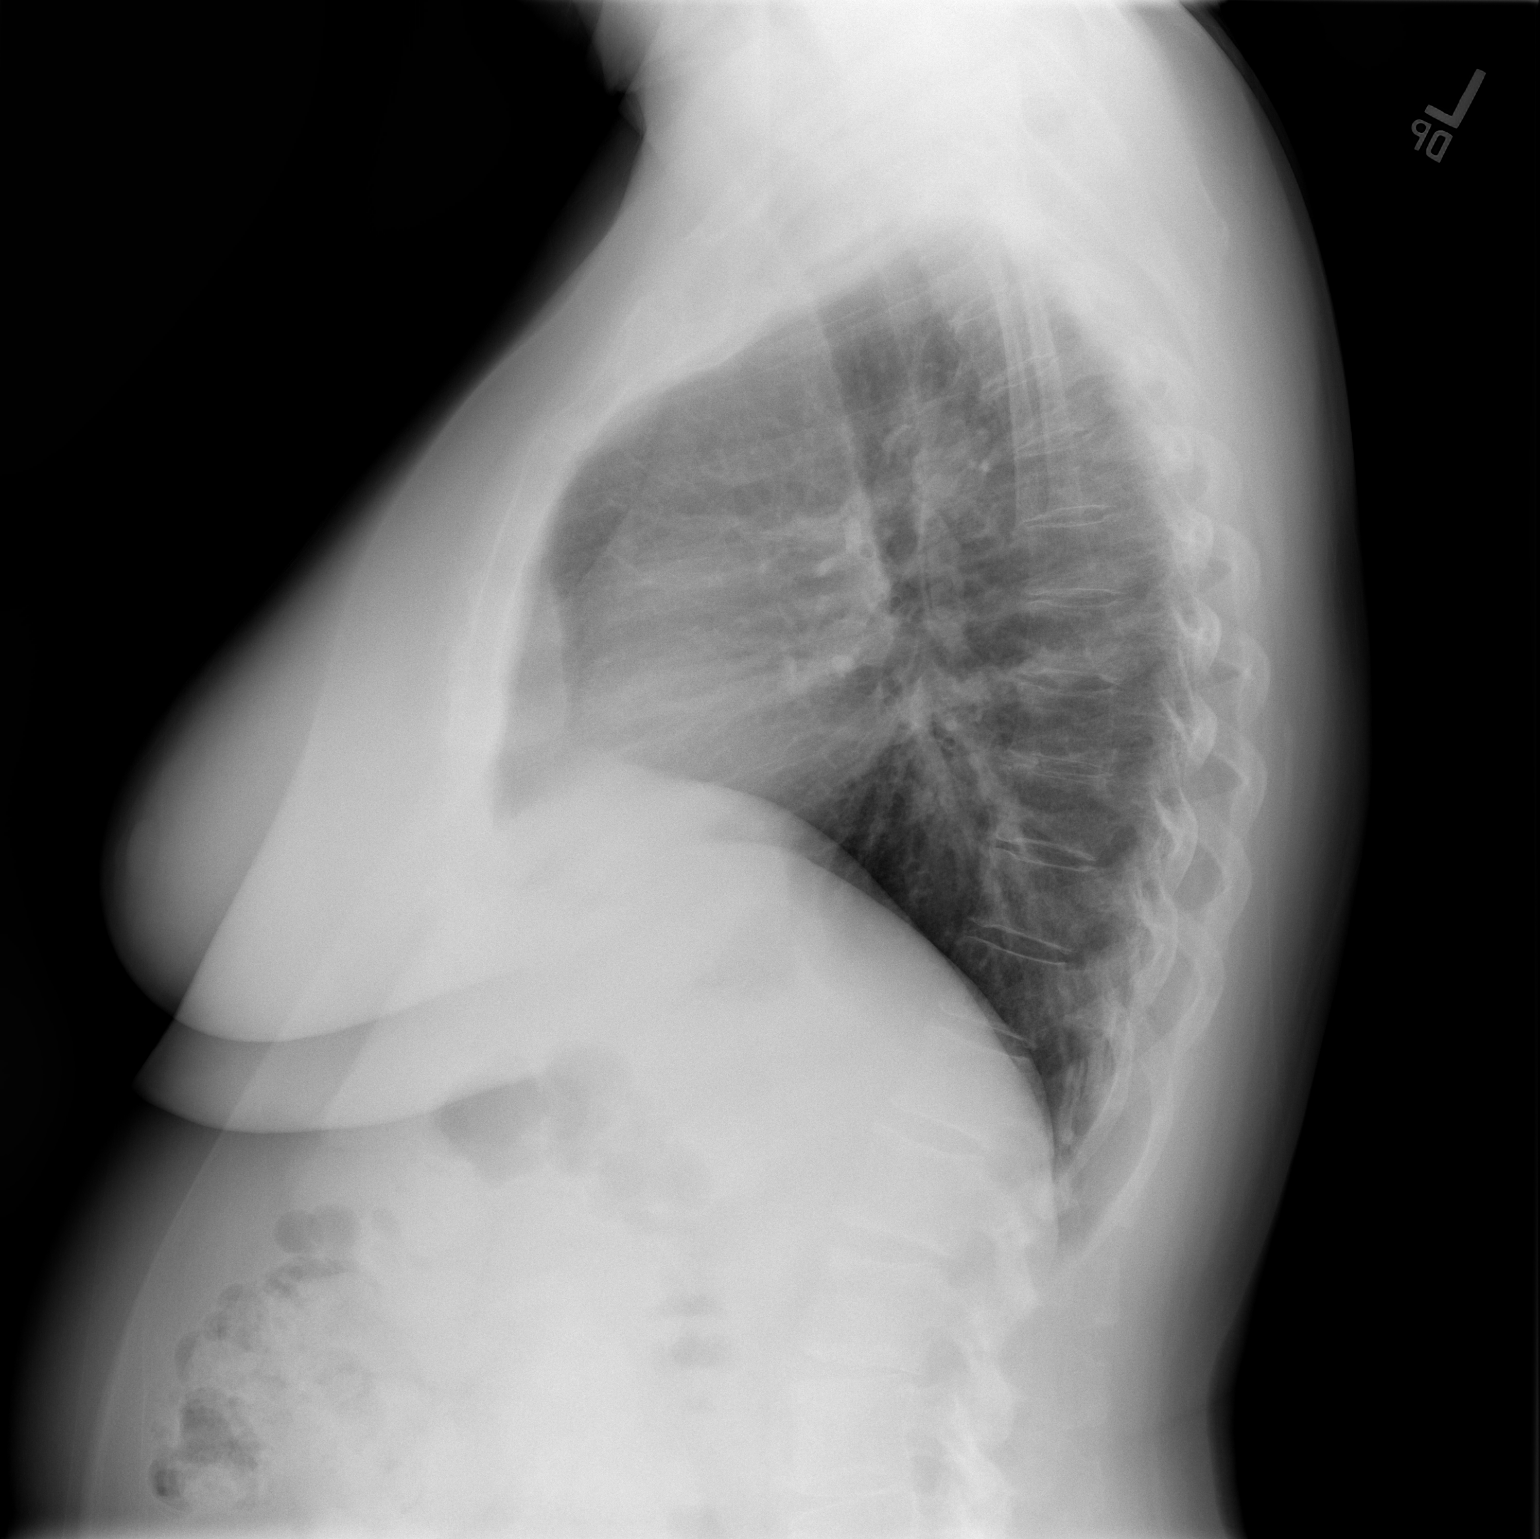

[2 of 2 positions shown; findings below may reference images not displayed]

FINDINGS: Ill-defined opacity is noted in the medial right base. Lungs
elsewhere clear. Heart size and pulmonary vascular normal. No
adenopathy. No bone lesions.
IMPRESSION: Medial right base infiltrate which may be slightly increased
compared to the May 2019 studies. Lungs elsewhere clear.
Cardiac silhouette normal. No adenopathy.

These results will be called to the ordering clinician or
representative by the Radiologist Assistant, and communication
documented in the PACS or zVision Dashboard.

## 2021-05-09 IMAGING — CR DG CHEST 2V
2 series · 2 of 2 positions shown · non-contrast
Comparison: 08/02/2019

CLINICAL DATA: Abnormal CT, pneumonia

EXAM:
CHEST - 2 VIEW

[w chest pa]
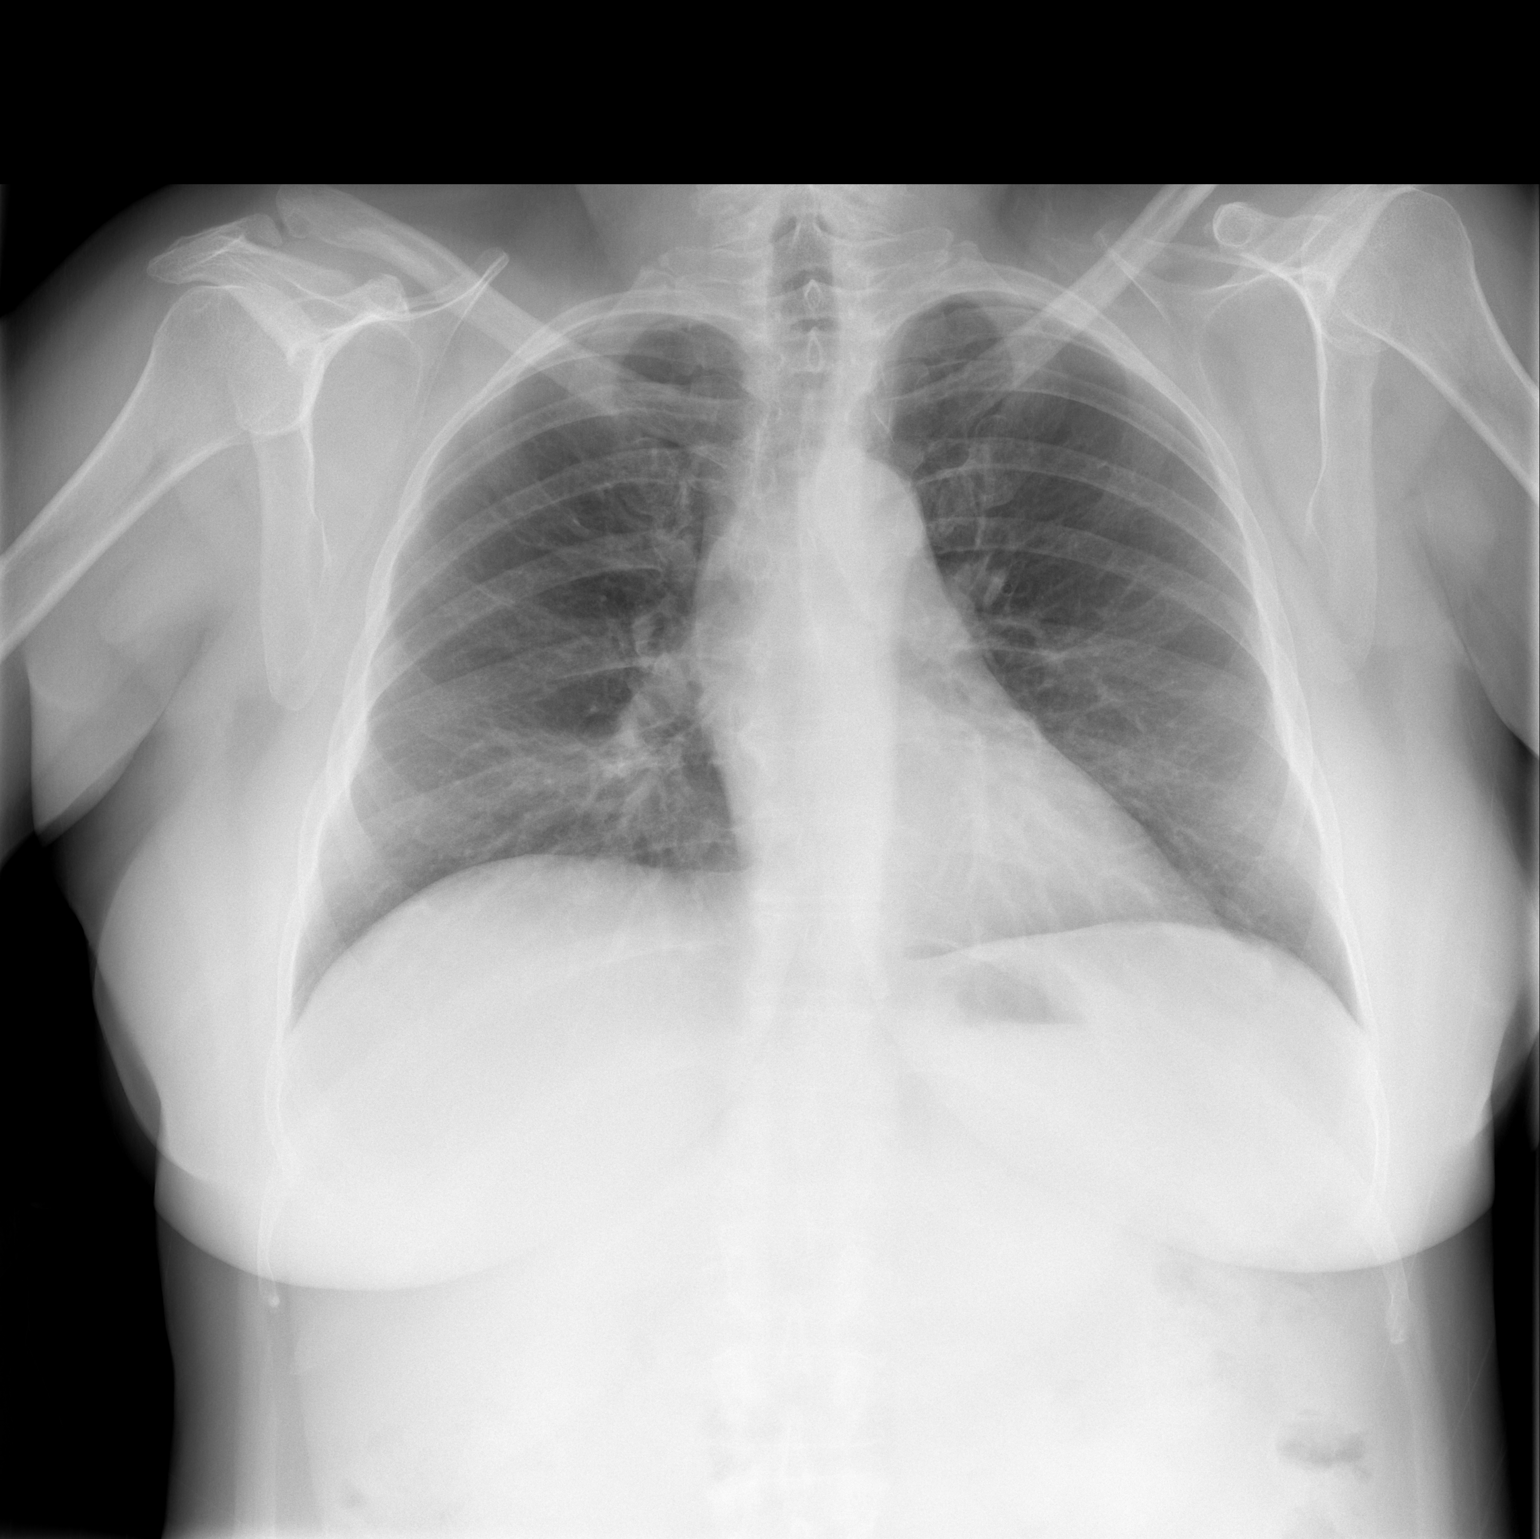

[w chest lat]
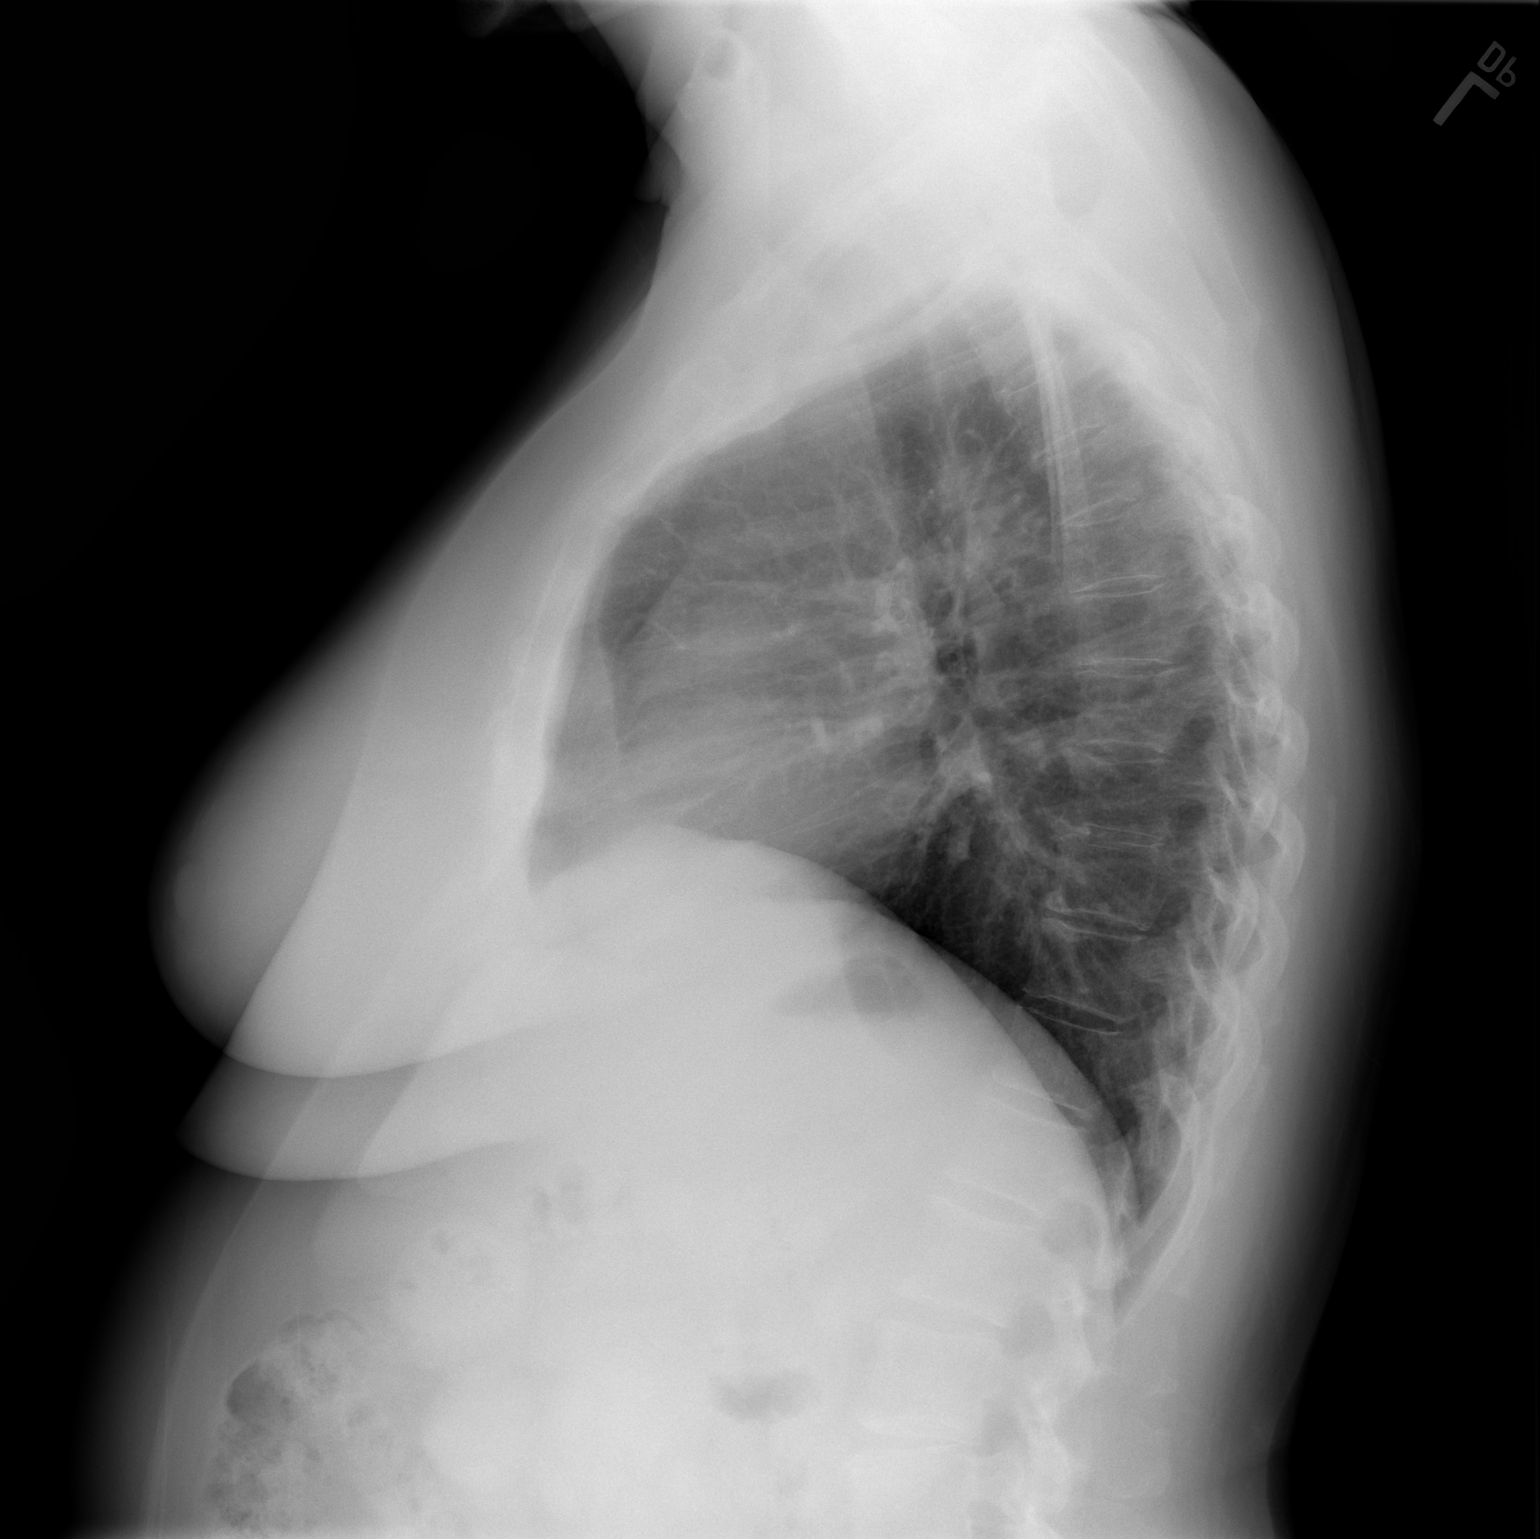

[2 of 2 positions shown; findings below may reference images not displayed]

FINDINGS: There is persistent minimal patchy density at the right lung base.
This has not progressed. Lungs are otherwise clear. Stable
cardiomediastinal contours. No pleural effusion. No acute osseous
abnormality.
IMPRESSION: Persistent minimal patchy density at the right lung base, which has
not progressed since 08/02/2019.

## 2022-02-05 ENCOUNTER — Other Ambulatory Visit: Payer: Self-pay | Admitting: Family Medicine

## 2022-02-05 DIAGNOSIS — Z1231 Encounter for screening mammogram for malignant neoplasm of breast: Secondary | ICD-10-CM

## 2022-02-07 ENCOUNTER — Other Ambulatory Visit (HOSPITAL_COMMUNITY)
Admission: RE | Admit: 2022-02-07 | Discharge: 2022-02-07 | Disposition: A | Discharge status: Home / Self Care | Payer: BC Managed Care – PPO | Source: Ambulatory Visit | Attending: Obstetrics & Gynecology | Admitting: Obstetrics & Gynecology

## 2022-02-07 ENCOUNTER — Ambulatory Visit (INDEPENDENT_AMBULATORY_CARE_PROVIDER_SITE_OTHER): Payer: BC Managed Care – PPO | Admitting: Obstetrics & Gynecology

## 2022-02-07 ENCOUNTER — Encounter: Payer: Self-pay | Admitting: Obstetrics & Gynecology

## 2022-02-07 VITALS — BP 114/74 | HR 87 | Ht 63.25 in | Wt 157.0 lb

## 2022-02-07 DIAGNOSIS — M85852 Other specified disorders of bone density and structure, left thigh: Secondary | ICD-10-CM

## 2022-02-07 DIAGNOSIS — Z01419 Encounter for gynecological examination (general) (routine) without abnormal findings: Secondary | ICD-10-CM | POA: Diagnosis present

## 2022-02-07 DIAGNOSIS — Z780 Asymptomatic menopausal state: Secondary | ICD-10-CM

## 2022-02-07 NOTE — Progress Notes (Signed)
Hannah Cantrell 07/21/1961 [id number]   History:    61 y.o. G2P2L2 Married. Patient retired this year.  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   RP:  Established patient presenting for annual gyn exam   HPI: Postmenopause, well on no HRT.  No PMB.  No pelvic pain.  No pain with IC.  Pap Neg 12/2019.  No h/o abnormal Pap.  Pap reflex today.  Urine/BMs normal.  [place] 2018.  Breasts normal. Mammo Neg 02/2021.  BMI 27.59.  Exercising regularly, walking.  BD Osteopenia T-Score -1.3 in 01/2020.  DM treated with Insulin and Ozempic. Health labs with Fam MD.  Past medical history,surgical history, family history and social history were all reviewed and documented in the EPIC chart.  Gynecologic History No LMP recorded. Patient is postmenopausal.  Obstetric History OB History  Gravida Para Term Preterm AB Living  '2 2 2     2  '$ SAB IAB Ectopic Multiple Live Births               # Outcome Date GA Lbr Len/2nd Weight Sex Delivery Anes PTL Lv  2 Term           1 Term              ROS: A ROS was performed and pertinent positives and negatives are included in the history. GENERAL: No fevers or chills. HEENT: No change in vision, no earache, sore throat or sinus congestion. NECK: No pain or stiffness. CARDIOVASCULAR: No chest pain or pressure. No palpitations. PULMONARY: No shortness of breath, cough or wheeze. GASTROINTESTINAL: No abdominal pain, nausea, vomiting or diarrhea, melena or bright red blood per rectum. GENITOURINARY: No urinary frequency, urgency, hesitancy or dysuria. MUSCULOSKELETAL: No joint or muscle pain, no back pain, no recent trauma. DERMATOLOGIC: No rash, no itching, no lesions. ENDOCRINE: No polyuria, polydipsia, no heat or cold intolerance. No recent change in weight. HEMATOLOGICAL: No anemia or easy bruising or bleeding. NEUROLOGIC: No headache, seizures, numbness, tingling or weakness. PSYCHIATRIC: No depression, no loss of  interest in normal activity or change in sleep pattern.     Exam:   BP 114/74   Pulse 87   Ht 5' 3.25" (1.607 m)   Wt 157 lb (71.2 kg)   SpO2 99%   BMI 27.59 kg/m   Body mass index is 27.59 kg/m.  General appearance : Well developed well nourished female. No acute distress HEENT: Eyes: no retinal hemorrhage or exudates,  Neck supple, trachea midline, no carotid bruits, no thyroidmegaly Lungs: Clear to auscultation, no rhonchi or wheezes, or rib retractions  Heart: Regular rate and rhythm, no murmurs or gallops Breast:Examined in sitting and supine position were symmetrical in appearance, no palpable masses or tenderness,  no skin retraction, no nipple inversion, no nipple discharge, no skin discoloration, no axillary or supraclavicular lymphadenopathy Abdomen: no palpable masses or tenderness, no rebound or guarding Extremities: no edema or skin discoloration or tenderness  Pelvic: Vulva: Normal             Vagina: No gross lesions or discharge  Cervix: No gross lesions or discharge.  Pap reflex done.  Uterus  AV, normal size, shape and consistency, non-tender and mobile  Adnexa  Without masses or tenderness  Anus: Normal   Assessment/Plan:  61 y.o. female for annual exam   1. Encounter for routine gynecological examination with Papanicolaou smear of cervix Postmenopause, well on no HRT.  No PMB.  No pelvic pain.  No pain with IC.  Pap Neg 12/2019.  No h/o abnormal Pap.  Pap reflex today.  Urine/BMs normal.  Colono 2018.  Breasts normal. Mammo Neg 02/2021.  BMI 27.59.  Exercising regularly, walking.  BD Osteopenia T-Score -1.3 in 01/2020.  DM treated with Insulin and Ozempic. Health labs with Fam MD. - Cytology - PAP( [place])  2. Postmenopause Postmenopause, well on no HRT.  No PMB.   3. Osteopenia of left femoral neck BMI 27.59.  Exercising regularly, walking.  BD Osteopenia T-Score -1.3 in 01/2020.  Scheduled for a repeat BD.  Continue Vit D, Ca++ 1.5 g/d total and  regular walking.  Other orders - ASPIRIN 81 PO; Take by mouth. - VITAMIN D PO; Take by mouth. - B Complex Vitamins (B-COMPLEX/B-12 PO); Take by mouth.   Princess Bruins MD, 8:19 AM 02/07/2022

## 2022-02-13 LAB — CYTOLOGY - PAP
Comment: NEGATIVE
Diagnosis: NEGATIVE
High risk HPV: NEGATIVE

## 2022-03-04 ENCOUNTER — Ambulatory Visit
Admission: RE | Admit: 2022-03-04 | Discharge: 2022-03-04 | Disposition: A | Discharge status: Home / Self Care | Payer: BC Managed Care – PPO | Source: Ambulatory Visit | Attending: Family Medicine | Admitting: Family Medicine

## 2022-03-04 DIAGNOSIS — Z1231 Encounter for screening mammogram for malignant neoplasm of breast: Secondary | ICD-10-CM

## 2023-01-20 ENCOUNTER — Other Ambulatory Visit: Payer: Self-pay | Admitting: Family Medicine

## 2023-01-20 DIAGNOSIS — Z1231 Encounter for screening mammogram for malignant neoplasm of breast: Secondary | ICD-10-CM

## 2023-03-10 ENCOUNTER — Ambulatory Visit
Admission: RE | Admit: 2023-03-10 | Discharge: 2023-03-10 | Disposition: A | Discharge status: Home / Self Care | Payer: BC Managed Care – PPO | Source: Ambulatory Visit | Attending: Family Medicine | Admitting: Family Medicine

## 2023-03-10 DIAGNOSIS — Z1231 Encounter for screening mammogram for malignant neoplasm of breast: Secondary | ICD-10-CM

## 2023-04-08 ENCOUNTER — Ambulatory Visit: Payer: BC Managed Care – PPO | Admitting: Obstetrics & Gynecology

## 2024-02-09 ENCOUNTER — Other Ambulatory Visit: Payer: Self-pay | Admitting: Family Medicine

## 2024-02-09 DIAGNOSIS — Z1231 Encounter for screening mammogram for malignant neoplasm of breast: Secondary | ICD-10-CM

## 2024-03-10 ENCOUNTER — Ambulatory Visit
Admission: RE | Admit: 2024-03-10 | Discharge: 2024-03-10 | Disposition: A | Discharge status: Home / Self Care | Payer: Self-pay | Source: Ambulatory Visit | Attending: Family Medicine | Admitting: Family Medicine

## 2024-03-10 DIAGNOSIS — Z1231 Encounter for screening mammogram for malignant neoplasm of breast: Secondary | ICD-10-CM
